# Patient Record
Sex: Female | Born: 1990 | Race: White | Hispanic: No | Marital: Single | Smoking: Never smoker
Health system: Southern US, Community
[De-identification: ages and names within clinical notes are randomized; demographics above are authoritative.]

## PROBLEM LIST (undated history)

## (undated) HISTORY — PX: TONSILLECTOMY AND ADENOIDECTOMY: SUR1326

---
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
## 2011-04-10 ENCOUNTER — Emergency Department (HOSPITAL_BASED_OUTPATIENT_CLINIC_OR_DEPARTMENT_OTHER)
Admission: EM | Admit: 2011-04-10 | Discharge: 2011-04-10 | Disposition: A | Discharge status: Home / Self Care | Payer: Self-pay | Attending: Emergency Medicine | Admitting: Emergency Medicine

## 2011-04-10 ENCOUNTER — Encounter: Payer: Self-pay | Admitting: *Deleted

## 2011-04-10 ENCOUNTER — Emergency Department (INDEPENDENT_AMBULATORY_CARE_PROVIDER_SITE_OTHER): Payer: Self-pay

## 2011-04-10 DIAGNOSIS — S39012A Strain of muscle, fascia and tendon of lower back, initial encounter: Secondary | ICD-10-CM

## 2011-04-10 DIAGNOSIS — R51 Headache: Secondary | ICD-10-CM

## 2011-04-10 DIAGNOSIS — S161XXA Strain of muscle, fascia and tendon at neck level, initial encounter: Secondary | ICD-10-CM

## 2011-04-10 DIAGNOSIS — Y9364 Activity, baseball: Secondary | ICD-10-CM | POA: Insufficient documentation

## 2011-04-10 DIAGNOSIS — R42 Dizziness and giddiness: Secondary | ICD-10-CM | POA: Insufficient documentation

## 2011-04-10 DIAGNOSIS — M542 Cervicalgia: Secondary | ICD-10-CM

## 2011-04-10 DIAGNOSIS — S335XXA Sprain of ligaments of lumbar spine, initial encounter: Secondary | ICD-10-CM | POA: Insufficient documentation

## 2011-04-10 DIAGNOSIS — M549 Dorsalgia, unspecified: Secondary | ICD-10-CM | POA: Insufficient documentation

## 2011-04-10 DIAGNOSIS — IMO0002 Reserved for concepts with insufficient information to code with codable children: Secondary | ICD-10-CM

## 2011-04-10 DIAGNOSIS — S060XAA Concussion with loss of consciousness status unknown, initial encounter: Secondary | ICD-10-CM

## 2011-04-10 DIAGNOSIS — Y9239 Other specified sports and athletic area as the place of occurrence of the external cause: Secondary | ICD-10-CM | POA: Insufficient documentation

## 2011-04-10 DIAGNOSIS — R0989 Other specified symptoms and signs involving the circulatory and respiratory systems: Secondary | ICD-10-CM | POA: Insufficient documentation

## 2011-04-10 DIAGNOSIS — X58XXXA Exposure to other specified factors, initial encounter: Secondary | ICD-10-CM

## 2011-04-10 DIAGNOSIS — S060X9A Concussion with loss of consciousness of unspecified duration, initial encounter: Secondary | ICD-10-CM

## 2011-04-10 DIAGNOSIS — S239XXA Sprain of unspecified parts of thorax, initial encounter: Secondary | ICD-10-CM | POA: Insufficient documentation

## 2011-04-10 DIAGNOSIS — S0990XA Unspecified injury of head, initial encounter: Secondary | ICD-10-CM

## 2011-04-10 DIAGNOSIS — S139XXA Sprain of joints and ligaments of unspecified parts of neck, initial encounter: Secondary | ICD-10-CM | POA: Insufficient documentation

## 2011-04-10 DIAGNOSIS — W19XXXA Unspecified fall, initial encounter: Secondary | ICD-10-CM

## 2011-04-10 DIAGNOSIS — W1801XA Striking against sports equipment with subsequent fall, initial encounter: Secondary | ICD-10-CM | POA: Insufficient documentation

## 2011-04-10 LAB — GLUCOSE, CAPILLARY: Glucose-Capillary: 139 mg/dL — ABNORMAL HIGH (ref 70–99)

## 2011-04-10 MED ORDER — HYDROMORPHONE HCL 2 MG/ML IJ SOLN
2.0000 mg | Freq: Once | INTRAMUSCULAR | Status: AC
  Administered 2011-04-10: 2 mg via INTRAMUSCULAR
  Filled 2011-04-10: qty 1

## 2011-04-10 MED ORDER — ONDANSETRON 4 MG PO TBDP
ORAL_TABLET | ORAL | Status: AC
  Filled 2011-04-10: qty 2

## 2011-04-10 MED ORDER — HYDROCODONE-ACETAMINOPHEN 5-325 MG PO TABS: 2.0000 | ORAL_TABLET | ORAL | Status: AC | PRN

## 2011-04-10 MED ORDER — ONDANSETRON 8 MG PO TBDP
8.0000 mg | ORAL_TABLET | Freq: Once | ORAL | Status: AC
  Administered 2011-04-10: 8 mg via ORAL
  Filled 2011-04-10: qty 1

## 2011-04-10 MED ORDER — ONDANSETRON 8 MG PO TBDP: 8.0000 mg | ORAL_TABLET | Freq: Three times a day (TID) | ORAL | Status: AC | PRN

## 2011-04-10 MED ORDER — PROMETHAZINE HCL 25 MG PO TABS
25.0000 mg | ORAL_TABLET | Freq: Once | ORAL | Status: AC
  Administered 2011-04-10: 25 mg via ORAL
  Filled 2011-04-10: qty 1

## 2011-04-10 MED ORDER — IBUPROFEN 600 MG PO TABS: 600.0000 mg | ORAL_TABLET | Freq: Three times a day (TID) | ORAL | Status: AC | PRN

## 2011-04-10 NOTE — ED Notes (Signed)
Taken off the back board. Pt states she feels much better off the board. c collar remains in place. Neuro intact.

## 2011-04-10 NOTE — ED Notes (Signed)
Parents notified per pt request.  Mom, Olivia Carrillo stated they were enroute

## 2011-04-10 NOTE — ED Notes (Signed)
Pt vomited as discharge instructions were given. MD aware. Zofran requested.

## 2011-04-10 NOTE — ED Notes (Signed)
Zofran not given. Plan to give Phenergan and observe pt til she feels better

## 2011-04-10 NOTE — ED Notes (Signed)
Pt presents to ED today via [facility] following injury while playing softball.  EMS reports "player collision head to collarbone.  LOC unknown"  Pt reports +LOC and is a&ox4 now.  Pt presnts on LSB and c-collar

## 2011-04-12 NOTE — ED Provider Notes (Signed)
History     CSN: [id number] Arrival date & time: 04/10/2011  7:44 PM  Chief Complaint  Patient presents with  . Fall  . Neck Injury   Patient is a 20 y.o. female presenting with neck injury, head injury, and back pain. The history is provided by the patient, a parent and the EMS personnel.  Neck Injury This is a new problem. The current episode started less than 1 hour ago. The problem occurs constantly. The problem has been gradually improving. Associated symptoms include chest pain and headaches. Pertinent negatives include no abdominal pain and no shortness of breath. The symptoms are aggravated by nothing. The symptoms are relieved by relaxation, position and rest. Treatments tried: immobization, long spine board by ems using cervical collar. The treatment provided mild relief.  Head Injury  The incident occurred less than 1 hour ago. She came to the ER via EMS. The injury mechanism was a direct blow (the patient was playing softball and collided with another player, hitting her head on the other player's chest). She lost consciousness for a period of less than one minute. There was no blood loss. The quality of the pain is described as dull and throbbing. The pain is moderate. The pain has been constant since the injury. Associated symptoms include disorientation. Pertinent negatives include no numbness, no blurred vision, no vomiting, no tinnitus, no weakness and no memory loss. She was found conscious by EMS personnel. Treatment on the scene included a backboard and a c-collar. She has tried nothing for the symptoms.  Back Pain  This is a new problem. The current episode started less than 1 hour ago. The problem occurs constantly. The problem has been gradually improving. The pain is associated with falling (as above). The pain is present in the thoracic spine and lumbar spine. The quality of the pain is described as aching. The pain does not radiate. The pain is moderate. Exacerbated by:  movement and palpation. Associated symptoms include chest pain and headaches. Pertinent negatives include no fever, no numbness, no abdominal pain, no bowel incontinence, no perianal numbness, no bladder incontinence, no leg pain, no paresthesias, no paresis, no tingling and no weakness. She has tried nothing for the symptoms.    History reviewed. No pertinent past medical history.  History reviewed. No pertinent past surgical history.  No family history on file.  History  Substance Use Topics  . Smoking status: Never Smoker   . Smokeless tobacco: Not on file  . Alcohol Use: No    OB History    Grav Para Term Preterm Abortions TAB SAB Ect Mult Living                  Review of Systems  Constitutional: Negative for fever.  HENT: Positive for neck pain and neck stiffness. Negative for hearing loss, ear pain, nosebleeds, congestion, facial swelling, rhinorrhea, drooling, trouble swallowing, dental problem, voice change, postnasal drip, tinnitus and ear discharge.   Eyes: Negative for blurred vision, pain and visual disturbance.  Respiratory: Negative for cough, chest tightness, shortness of breath, wheezing and stridor.   Cardiovascular: Positive for chest pain.  Gastrointestinal: Positive for nausea. Negative for vomiting, abdominal pain, abdominal distention and bowel incontinence.  Genitourinary: Negative for bladder incontinence and flank pain.  Musculoskeletal: Positive for myalgias and back pain. Negative for joint swelling, arthralgias and gait problem.  Skin: Negative for color change, pallor, rash and wound.  Neurological: Positive for light-headedness and headaches. Negative for dizziness, tingling, tremors, seizures, facial  asymmetry, speech difficulty, weakness, numbness and paresthesias.  Hematological: Does not bruise/bleed easily.  Psychiatric/Behavioral: Negative.  Negative for memory loss.    Physical Exam  BP 104/70  Pulse 68  Temp(Src) 98.1 F (36.7 C) (Oral)   Resp 18  SpO2 100%  LMP 04/05/2011  Physical Exam  Nursing note and vitals reviewed. Constitutional: She is oriented to person, place, and time. She appears well-developed and well-nourished. She appears distressed.       The patient appears in mild discomfort  HENT:  Head: Normocephalic and atraumatic. Head is without raccoon's eyes, without Battle's sign, without contusion and without laceration.  Right Ear: Hearing, tympanic membrane, external ear and ear canal normal. No drainage or tenderness. No mastoid tenderness. Tympanic membrane is not perforated. No hemotympanum.  Left Ear: Hearing, tympanic membrane, external ear and ear canal normal. No drainage or tenderness. No mastoid tenderness. Tympanic membrane is not perforated. No hemotympanum.  Nose: Nose normal. No mucosal edema, rhinorrhea, sinus tenderness, nasal deformity, septal deviation or nasal septal hematoma. No epistaxis. Right sinus exhibits no maxillary sinus tenderness and no frontal sinus tenderness. Left sinus exhibits no maxillary sinus tenderness and no frontal sinus tenderness.  Mouth/Throat: Uvula is midline, oropharynx is clear and moist and mucous membranes are normal. Normal dentition. No lacerations.  Eyes: Conjunctivae and EOM are normal. Pupils are equal, round, and reactive to light.  Neck: Trachea normal and phonation normal. No JVD present. Spinous process tenderness and muscular tenderness present. No tracheal tenderness present. Carotid bruit is present. No tracheal deviation present.       Immobilized in c-collar  Cardiovascular: Normal rate, regular rhythm, normal heart sounds and intact distal pulses.  Exam reveals no gallop and no friction rub.   No murmur heard. Pulmonary/Chest: Effort normal and breath sounds normal. No accessory muscle usage or stridor. No respiratory distress. She has no decreased breath sounds. She has no wheezes. She has no rales. She exhibits no tenderness, no bony tenderness, no  crepitus, no deformity and no retraction.  Abdominal: Soft. Bowel sounds are normal. She exhibits no distension and no mass. There is no tenderness. There is no rebound and no guarding.  Musculoskeletal: Normal range of motion. She exhibits no edema and no tenderness.       Cervical back: She exhibits tenderness, bony tenderness and pain. She exhibits no swelling and no deformity.       Thoracic back: She exhibits tenderness, bony tenderness and pain. She exhibits no deformity.       Lumbar back: She exhibits tenderness, bony tenderness and pain. She exhibits no deformity.  Neurological: She is alert and oriented to person, place, and time. She has normal reflexes. She is not disoriented. She displays normal reflexes. No cranial nerve deficit or sensory deficit. She exhibits normal muscle tone. Coordination normal. GCS eye subscore is 4. GCS verbal subscore is 5. GCS motor subscore is 6.  Skin: Skin is warm and dry. No rash noted. No erythema. No pallor.  Psychiatric: She has a normal mood and affect. Her behavior is normal. Judgment and thought content normal.    ED Course  Procedures  MDM Closed head injury, concussion, cervical/thoracic/lumbar strain, fracture, dislocation  Dg Thoracic Spine W/swimmers  04/10/2011  *RADIOLOGY REPORT*  Clinical Data: Status post collision, with back pain.  THORACIC SPINE - 2 VIEW + SWIMMERS  Comparison: None.  Findings: The imaged vertebral bodies and inter-vertebral disc spaces are maintained. No displaced acute fracture or dislocation identified.   The para-vertebral  and overlying soft tissues are within normal limits.  IMPRESSION: No acute osseous abnormality.  Original Report Authenticated By: Waneta Martins, M.D.   Dg Lumbar Spine Complete  04/10/2011  *RADIOLOGY REPORT*  Clinical Data: Back pain status post collision.  LUMBAR SPINE - COMPLETE 4+ VIEW  Comparison: Contemporaneous thoracic spine radiograph  Findings: The lumbar vertebral bodies and  inter-vertebral disc spaces are maintained. No displaced acute fracture or dislocation identified.   The para-vertebral and overlying soft tissues are within normal limits.  IMPRESSION: No acute osseous abnormality.  Original Report Authenticated By: Waneta Martins, M.D.   Ct Head Wo Contrast  04/10/2011  *RADIOLOGY REPORT*  Clinical Data:  Collision with another player, now with headache and neck pain  CT HEAD WITHOUT CONTRAST CT CERVICAL SPINE WITHOUT CONTRAST  Technique:  Multidetector CT imaging of the head and cervical spine was performed following the standard protocol without intravenous contrast.  Multiplanar CT image reconstructions of the cervical spine were also generated.  Comparison:  None  CT HEAD  Findings: Wallace Cullens white differentiation is maintained.  No CT evidence of acute infarct.  No intraparenchymal or extra-axial mass or hemorrhage.  Ventricles and basilar cisterns are normal in size configuration.  No midline shift.  The visualized paranasal sinuses and mastoid air cells are normal.  Regional soft tissues are normal.  No calvarial fracture.  IMPRESSION: No acute intraparenchymal disease.  CT CERVICAL SPINE  Findings: There is mild straightening of the expected cervical lordosis.  No anterolisthesis or retrolisthesis.  Normal atlanto- odontoid and atlantoaxial articulations.  The dens is normally positioned between the lateral masses of C1.  Vertebral body heights are preserved.  Prevertebral soft tissues are normal.  No fracture.  Intervertebral disc spaces are preserved.  Regional soft tissues are normal.  Limited visualization of the lung apices is normal.  IMPRESSION: No fracture or static subluxation of the cervical spine.  As ligamentous injury is not excluded, continue cervical spine precautions are recommended until the patient is cleared clinically.  Original Report Authenticated By: Waynard Reeds, M.D.   Ct Cervical Spine Wo Contrast  04/10/2011  *RADIOLOGY REPORT*  Clinical  Data:  Collision with another player, now with headache and neck pain  CT HEAD WITHOUT CONTRAST CT CERVICAL SPINE WITHOUT CONTRAST  Technique:  Multidetector CT imaging of the head and cervical spine was performed following the standard protocol without intravenous contrast.  Multiplanar CT image reconstructions of the cervical spine were also generated.  Comparison:  None  CT HEAD  Findings: Wallace Cullens white differentiation is maintained.  No CT evidence of acute infarct.  No intraparenchymal or extra-axial mass or hemorrhage.  Ventricles and basilar cisterns are normal in size configuration.  No midline shift.  The visualized paranasal sinuses and mastoid air cells are normal.  Regional soft tissues are normal.  No calvarial fracture.  IMPRESSION: No acute intraparenchymal disease.  CT CERVICAL SPINE  Findings: There is mild straightening of the expected cervical lordosis.  No anterolisthesis or retrolisthesis.  Normal atlanto- odontoid and atlantoaxial articulations.  The dens is normally positioned between the lateral masses of C1.  Vertebral body heights are preserved.  Prevertebral soft tissues are normal.  No fracture.  Intervertebral disc spaces are preserved.  Regional soft tissues are normal.  Limited visualization of the lung apices is normal.  IMPRESSION: No fracture or static subluxation of the cervical spine.  As ligamentous injury is not excluded, continue cervical spine precautions are recommended until the patient is cleared clinically.  Original Report Authenticated By: Waynard Reeds, M.D.   Dg Cerv Spine Flex&ext Only  04/10/2011  *RADIOLOGY REPORT*  Clinical Data: Posterior neck pain.  Evaluate for ligamentous instability  CERVICAL SPINE - FLEXION AND EXTENSION VIEWS ONLY  Comparison: CT same date  Findings: C1 through the cervical thoracic junction is visualized in its entirety.  No listhesis is seen on flexion or extension views.  Normal mobility is identified. No precervical soft tissue  widening is present.  No acute osseous abnormality.  IMPRESSION: No listhesis is identified on flexion or extension views.  Original Report Authenticated By: Harrel Lemon, M.D.   Cervical spine cleared by radiographs.  The patient had no pain with movement of her head in rotation or flexion/extension with the C-collar removed, but due to mild sedation after analgesics/mm. Relaxants, a flex-ext. Series was obtained to assure no ligamentous injury.  The patient appears to have had a minor head injury sustaining a concussion, and cervical/thoracic/lumbar strain.  She was discharged awake, alert, and oriented with improved comfort to her family's care.      Felisa Bonier, MD 04/12/11 2153

## 2013-02-10 ENCOUNTER — Encounter: Payer: Self-pay | Admitting: Family Medicine

## 2013-02-10 ENCOUNTER — Ambulatory Visit (INDEPENDENT_AMBULATORY_CARE_PROVIDER_SITE_OTHER): Payer: BC Managed Care – PPO | Admitting: Family Medicine

## 2013-02-10 VITALS — BP 112/88 | HR 96 | Temp 98.1°F | Ht 70.25 in | Wt 186.0 lb

## 2013-02-10 DIAGNOSIS — Z87898 Personal history of other specified conditions: Secondary | ICD-10-CM

## 2013-02-10 DIAGNOSIS — Z833 Family history of diabetes mellitus: Secondary | ICD-10-CM

## 2013-02-10 DIAGNOSIS — Z136 Encounter for screening for cardiovascular disorders: Secondary | ICD-10-CM

## 2013-02-10 DIAGNOSIS — Z9189 Other specified personal risk factors, not elsewhere classified: Secondary | ICD-10-CM

## 2013-02-10 LAB — CBC WITH DIFFERENTIAL/PLATELET
Basophils Absolute: 0 10*3/uL (ref 0.0–0.1)
Eosinophils Absolute: 0.1 10*3/uL (ref 0.0–0.7)
HCT: 43.3 % (ref 36.0–46.0)
Lymphs Abs: 2.2 10*3/uL (ref 0.7–4.0)
MCHC: 33.9 g/dL (ref 30.0–36.0)
MCV: 92.6 fl (ref 78.0–100.0)
Monocytes Absolute: 0.5 10*3/uL (ref 0.1–1.0)
Neutrophils Relative %: 55.5 % (ref 43.0–77.0)
Platelets: 236 10*3/uL (ref 150.0–400.0)
RDW: 12.5 % (ref 11.5–14.6)

## 2013-02-10 LAB — COMPREHENSIVE METABOLIC PANEL
Alkaline Phosphatase: 58 U/L (ref 39–117)
CO2: 31 mEq/L (ref 19–32)
Creatinine, Ser: 0.8 mg/dL (ref 0.4–1.2)
GFR: 93.79 mL/min (ref >60.00)
Glucose, Bld: 104 mg/dL — ABNORMAL HIGH (ref 70–99)
Sodium: 138 mEq/L (ref 135–145)
Total Bilirubin: 0.6 mg/dL (ref 0.3–1.2)
Total Protein: 8.2 g/dL (ref 6.0–8.3)

## 2013-02-10 LAB — LIPID PANEL
Cholesterol: 227 mg/dL — ABNORMAL HIGH (ref 0–200)
HDL: 44.3 mg/dL (ref >39.00)
Total CHOL/HDL Ratio: 5
VLDL: 42.2 mg/dL — ABNORMAL HIGH (ref 0.0–40.0)

## 2013-02-10 LAB — LDL CHOLESTEROL, DIRECT: Direct LDL: 137.8 mg/dL

## 2013-02-10 NOTE — Progress Notes (Signed)
  Subjective:    Patient ID: Olivia Carrillo, female    DOB: 11/17/1990, 22 y.o.   MRN: [id number]  HPI  22 yo very pleasant female here to establish care.  She states that her mom is concerned that she has diabetes.  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 Feels she needs to eat every couple of hours or she gets light headed.  No recent syncope.  Did have syncopal episode once in shower in 2010.  None since. Not sure if her dad has DM but her dad's dad has DM and HTN.  No increased thirst, urination or weight loss.  Not sexually active.  Never had a pap smear. No family h/o breast, uterine, or cervical CA.  Patient Active Problem List   Diagnosis Date Noted  . Family history of diabetes mellitus 02/10/2013  . History of syncope 02/10/2013   No past medical history on file. Past Surgical History  Procedure Laterality Date  . Tonsillectomy and adenoidectomy     History  Substance Use Topics  . Smoking status: Never Smoker   . Smokeless tobacco: Not on file  . Alcohol Use: No   Family History  Problem Relation Age of Onset  . Diabetes Paternal Grandmother   . Hypertension Paternal Grandmother    No Known Allergies No current outpatient prescriptions on file prior to visit.   No current facility-administered medications on file prior to visit.   The PMH, PSH, Social History, Family History, Medications, and allergies have been reviewed in [facility], and have been updated if relevant.   Review of Systems See HPI No CP No SOB No dizziness    Objective:   Physical Exam  Constitutional: She is oriented to person, place, and time. She appears well-developed and well-nourished. No distress.  HENT:  Head: Normocephalic.  Right Ear: External ear normal.  Left Ear: External ear normal.  Eyes: Conjunctivae and EOM are normal. Pupils are equal, round, and reactive to light.  Cardiovascular: Normal rate, regular rhythm and normal heart sounds.    Pulmonary/Chest: Effort normal and breath sounds normal.  Abdominal: Soft. Bowel sounds are normal.  Neurological: She is alert and oriented to person, place, and time. She has normal reflexes.  Skin: Skin is warm and dry.  Psychiatric: She has a normal mood and affect. Her behavior is normal. Judgment normal.   BP 112/88  Pulse 96  Temp(Src) 98.1 F (36.7 C)  Ht 5' 10.25" (1.784 m)  Wt 186 lb (84.369 kg)  BMI 26.51 kg/m2        Assessment & Plan:  1. Family history of diabetes mellitus No concerning symptoms on history but will check labs today. - Hemoglobin A1c  2. History of syncope No recurrent episodes.  Was likely due to dehydration at the time. - Comprehensive metabolic panel - TSH - CBC with Differential  3. Screening for ischemic heart disease  - Lipid Panel

## 2013-02-10 NOTE — Patient Instructions (Addendum)
It was nice to meet you. Please make an appointment for a complete physical/pap smear.  We will call you with your labs results.

## 2013-02-14 ENCOUNTER — Encounter: Payer: Self-pay | Admitting: Family Medicine

## 2013-04-28 ENCOUNTER — Encounter: Payer: BC Managed Care – PPO | Admitting: Family Medicine

## 2013-06-16 ENCOUNTER — Other Ambulatory Visit: Payer: Self-pay

## 2013-08-30 ENCOUNTER — Encounter: Payer: BC Managed Care – PPO | Admitting: Family Medicine

## 2014-03-24 ENCOUNTER — Encounter: Payer: Self-pay | Admitting: Family Medicine

## 2014-03-24 ENCOUNTER — Ambulatory Visit (INDEPENDENT_AMBULATORY_CARE_PROVIDER_SITE_OTHER): Payer: BC Managed Care – PPO | Admitting: Family Medicine

## 2014-03-24 VITALS — BP 110/76 | HR 92 | Temp 98.0°F | Ht 70.0 in | Wt 165.5 lb

## 2014-03-24 DIAGNOSIS — Z0289 Encounter for other administrative examinations: Secondary | ICD-10-CM

## 2014-03-24 DIAGNOSIS — Z23 Encounter for immunization: Secondary | ICD-10-CM

## 2014-03-24 DIAGNOSIS — Z020 Encounter for examination for admission to educational institution: Secondary | ICD-10-CM | POA: Insufficient documentation

## 2014-03-24 NOTE — Progress Notes (Signed)
Pre visit review using our clinic review tool, if applicable. No additional management support is needed unless otherwise documented below in the visit note. 

## 2014-03-24 NOTE — Assessment & Plan Note (Signed)
[facility] records printed out and reviewed with pt. Does not appear complete but she says she has some at home and [facility]. Tdap given today.

## 2014-03-24 NOTE — Progress Notes (Signed)
   Subjective:   Patient ID: Olivia Carrillo, female    DOB: 05-Oct-1990, 23 y.o.   MRN: [id number]  Olivia Carrillo is a pleasant 23 y.o. year old female who presents to clinic today with Immunizations  on 03/24/2014  HPI:  Was in school for criminal justice but she did not like it.   Has now decided to go back to school for kinesiology.  Not sure which immunzations she needs but thinks she definitely needs Tdap.  No current outpatient prescriptions on file prior to visit.   No current facility-administered medications on file prior to visit.    No Known Allergies  No past medical history on file.  Past Surgical History  Procedure Laterality Date  . Tonsillectomy and adenoidectomy      Family History  Problem Relation Age of Onset  . Diabetes Paternal Grandmother   . Hypertension Paternal Grandmother     History   Social History  . Marital Status: Single    Spouse Name: N/A    Number of Children: N/A  . Years of Education: N/A   Occupational History  . Not on file.   Social History Main Topics  . Smoking status: Never Smoker   . Smokeless tobacco: Not on file  . Alcohol Use: No  . Drug Use: No  . Sexual Activity:    Other Topics Concern  . 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   The PMH, PSH, Social History, Family History, Medications, and allergies have been reviewed [facility], and have been updated if relevant.    Review of Systems    See HPI +intermittent headaches Denies anxiety or depression No SI or HI Objective:    BP 110/76  Pulse 92  Temp(Src) 98 F (36.7 C) (Oral)  Ht 5\' 10"  (1.778 m)  Wt 165 lb 8 oz (75.07 kg)  BMI 23.75 kg/m2  SpO2 97%  LMP 02/26/2014   Physical Exam  Nursing note and vitals reviewed. Constitutional: She appears well-developed and well-nourished. No distress.  Cardiovascular: Normal rate, regular rhythm and normal heart sounds.   Pulmonary/Chest: Effort  normal and breath sounds normal.  Skin: Skin is warm and dry.  Psychiatric: She has a normal mood and affect. Her behavior is normal. Thought content normal.          Assessment & Plan:   Need for Tdap vaccination - Plan: Tdap vaccine greater than or equal to 7yo IM  Encounter for school history and physical examination No Follow-up on file.

## 2014-06-13 ENCOUNTER — Ambulatory Visit (INDEPENDENT_AMBULATORY_CARE_PROVIDER_SITE_OTHER): Payer: BC Managed Care – PPO | Admitting: Internal Medicine

## 2014-06-13 ENCOUNTER — Encounter: Payer: Self-pay | Admitting: Internal Medicine

## 2014-06-13 VITALS — BP 112/80 | HR 98 | Temp 98.1°F | Wt 170.0 lb

## 2014-06-13 DIAGNOSIS — J309 Allergic rhinitis, unspecified: Secondary | ICD-10-CM

## 2014-06-13 DIAGNOSIS — B309 Viral conjunctivitis, unspecified: Secondary | ICD-10-CM

## 2014-06-13 NOTE — Progress Notes (Signed)
HPI  Pt presents to the clinic today with c/o cough, chest congestions, nasal congestion and post nasal drip. She reports this started 4 days ago. She denies fever, chills or body aches. She is blowing clear mucous out of her nose. She also reports that the last 2 days her left eye has been matted shut when she wakes up. It is irritated but not painful. She has tried Careers adviserallegra and Nyquil without much relief. She does have a history of seasonal allergies. She has not had sick contacts. She does not smoke.  Review of Systems     History reviewed. No pertinent past medical history.  Family History  Problem Relation Age of Onset  . Diabetes Paternal Grandmother   . Hypertension Paternal Grandmother     History   Social History  . Marital Status: Single    Spouse Name: N/A    Number of Children: N/A  . Years of Education: N/A   Occupational History  . Not on file.   Social History Main Topics  . Smoking status: Never Smoker   . Smokeless tobacco: Not on file  . Alcohol Use: No  . Drug Use: No  . Sexual Activity: Not on file   Other Topics Concern  . 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    No Known Allergies   Constitutional:  Denies headache, fatigue, fever or abrupt weight changes.  HEENT:  Positive nasal congestion, sore throat. Denies eye redness, eye pain, pressure behind the eyes, facial pain, nasal congestion, ear pain, ringing in the ears, wax buildup, runny nose or bloody nose. Respiratory: Positive cough. Denies difficulty breathing or shortness of breath.  Cardiovascular: Denies chest pain, chest tightness, palpitations or swelling in the hands or feet.   No other specific complaints in a complete review of systems (except as listed in HPI above).  Objective:   BP 112/80 mmHg  Pulse 98  Temp(Src) 98.1 F (36.7 C) (Oral)  Wt 170 lb (77.111 kg)  SpO2 98%  LMP 05/23/2014 Wt Readings from Last 3  Encounters:  06/13/14 170 lb (77.111 kg)  03/24/14 165 lb 8 oz (75.07 kg)  02/10/13 186 lb (84.369 kg)     General: Appears her stated age, well developed, well nourished in NAD. HEENT: Head: normal shape and size; Eyes: sclera injected no icterus, conjunctiva pink, PERRLA and EOMs intact; Ears: Tm's gray and intact, normal light reflex; Nose: mucosa pink and moist, septum midline; Throat/Mouth: + PND. Teeth present, mucosa erythematous and moist, no exudate noted, no lesions or ulcerations noted.   Cardiovascular: Normal rate and rhythm. S1,S2 noted.  No murmur, rubs or gallops noted. Pulmonary/Chest: Normal effort and positive vesicular breath sounds. No respiratory distress. No wheezes, rales or ronchi noted.      Assessment & Plan:  Allergic Rhintis:  Get some rest and drink plenty of water Do salt water gargles for the sore throat Try some flonase in addition to your allegra Watch for fever, chills or colored nasal mucous  Viral conjunctivitis left eye:  No evidence of bacterial infection Try saline eye drops Warm compresses TID   RTC as needed or if symptoms persist or worsen  RTC as needed or if symptoms persist.

## 2014-06-13 NOTE — Patient Instructions (Signed)
Viral Conjunctivitis Conjunctivitis is an irritation (inflammation) of the clear membrane that covers the white part of the eye (the conjunctiva). The irritation can also happen on the underside of the eyelids. Conjunctivitis makes the eye red or pink in color. This is what is commonly known as pink eye. Viral conjunctivitis can spread easily (contagious). CAUSES   Infection from virus on the surface of the eye.  Infection from the irritation or injury of nearby tissues such as the eyelids or cornea.  More serious inflammation or infection on the inside of the eye.  Other eye diseases.  The use of certain eye medications. SYMPTOMS  The normally white color of the eye or the underside of the eyelid is usually pink or red in color. The pink eye is usually associated with irritation, tearing and some sensitivity to light. Viral conjunctivitis is often associated with a clear, watery discharge. If a discharge is present, there may also be some blurred vision in the affected eye. DIAGNOSIS  Conjunctivitis is diagnosed by an eye exam. The eye specialist looks for changes in the surface tissues of the eye which take on changes characteristic of the specific types of conjunctivitis. A sample of any discharge may be collected on a Q-Tip (sterile swap). The sample will be sent to a lab to see whether or not the inflammation is caused by bacterial or viral infection. TREATMENT  Viral conjunctivitis will not respond to medicines that kill germs (antibiotics). Treatment is aimed at stopping a bacterial infection on top of the viral infection. The goal of treatment is to relieve symptoms (such as itching) with antihistamine drops or other eye medications.  HOME CARE INSTRUCTIONS   To ease discomfort, apply a cool, clean wash cloth to your eye for 10 to 20 minutes, 3 to 4 times a day.  Gently wipe away any drainage from the eye with a warm, wet washcloth or a cotton ball.  Wash your hands often with soap  and use paper towels to dry.  Do not share towels or washcloths. This may spread the infection.  Change or wash your pillowcase every day.  You should not use eye make-up until the infection is gone.  Stop using contacts lenses. Ask your eye professional how to sterilize or replace them before using again. This depends on the type of contact lenses used.  Do not touch the edge of the eyelid with the eye drop bottle or ointment tube when applying medications to the affected eye. This will stop you from spreading the infection to the other eye or to others. SEEK IMMEDIATE MEDICAL CARE IF:   The infection has not improved within 3 days of beginning treatment.  A watery discharge from the eye develops.  Pain in the eye increases.  The redness is spreading.  Vision becomes blurred.  An oral temperature above 102 F (38.9 C) develops, or as your caregiver suggests.  Facial pain, redness or swelling develops.  Any problems that may be related to the prescribed medicine develop. MAKE SURE YOU:   Understand these instructions.  Will watch your condition.  Will get help right away if you are not doing well or get worse. Document Released: 07/28/2005 Document Revised: 10/20/2011 Document Reviewed: 03/16/2008 ExitCare Patient Information 2015 [employer]. This information is not intended to replace advice given to you by your health care provider. Make sure you discuss any questions you have with your health care provider.  

## 2014-06-13 NOTE — Progress Notes (Signed)
Pre visit review using our clinic review tool, if applicable. No additional management support is needed unless otherwise documented below in the visit note. 

## 2014-11-08 ENCOUNTER — Encounter: Payer: Self-pay | Admitting: Primary Care

## 2014-11-08 ENCOUNTER — Ambulatory Visit (INDEPENDENT_AMBULATORY_CARE_PROVIDER_SITE_OTHER): Payer: 59 | Admitting: Primary Care

## 2014-11-08 VITALS — BP 116/72 | HR 92 | Temp 98.5°F | Ht 70.0 in | Wt 176.0 lb

## 2014-11-08 DIAGNOSIS — H6123 Impacted cerumen, bilateral: Secondary | ICD-10-CM | POA: Diagnosis not present

## 2014-11-08 NOTE — Progress Notes (Signed)
Pre visit review using our clinic review tool, if applicable. No additional management support is needed unless otherwise documented below in the visit note. 

## 2014-11-08 NOTE — Progress Notes (Signed)
   Subjective:    Patient ID: Olivia Carrillo, female    DOB: 1991/01/29, 25 y.o.   MRN: [id number]  HPI  Olivia Carrillo is a 24 year old female who presents today with a chief complaint of left ear popping with decreased hearing for the past 3 days. She denies ear pain, sore throat, cough, fevers, itching. She's tried pouring peroxide in her left ear with only temporary relief. Nothing makes her symptoms worse.  Review of Systems  Constitutional: Negative for fever and chills.  HENT: Negative for ear discharge, ear pain and rhinorrhea.        Fullness to left ear  Respiratory: Negative for cough and shortness of breath.   Cardiovascular: Negative for chest pain.  Gastrointestinal: Negative for nausea.  Neurological: Negative for dizziness.       No past medical history on file.  History   Social History  . Marital Status: Single    Spouse Name: N/A  . Number of Children: N/A  . Years of Education: N/A   Occupational History  . Not on file.   Social History Main Topics  . Smoking status: Never Smoker   . Smokeless tobacco: Not on file  . Alcohol Use: No  . Drug Use: No  . Sexual Activity: Not on file   Other Topics Concern  . 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    Past Surgical History  Procedure Laterality Date  . Tonsillectomy and adenoidectomy      Family History  Problem Relation Age of Onset  . Diabetes Paternal Grandmother   . Hypertension Paternal Grandmother     No Known Allergies  No current outpatient prescriptions on file prior to visit.   No current facility-administered medications on file prior to visit.    BP 116/72 mmHg  Pulse 92  Temp(Src) 98.5 F (36.9 C) (Oral)  Ht $R'5\' 10"'nZ$  (1.778 m)  Wt 176 lb (79.833 kg)  BMI 25.25 kg/m2  SpO2 98%  LMP 09/10/2014    Objective:   Physical Exam  Constitutional: She is oriented to person, place, and time. She appears well-developed.    HENT:  Right Ear: External ear normal.  Left Ear: External ear normal.  Nose: Nose normal.  Mouth/Throat: Oropharynx is clear and moist.  Moderate cerumen impaction noted to bilateral ears initially. Copious amoutn of cerumen was removed. Patient able to hear again and feels much better.  Eyes: Conjunctivae are normal. Pupils are equal, round, and reactive to light.  Neck: Neck supple.  Cardiovascular: Normal rate and regular rhythm.   Pulmonary/Chest: Effort normal and breath sounds normal.  Lymphadenopathy:    She has no cervical adenopathy.  Neurological: She is alert and oriented to person, place, and time.  Skin: Skin is warm and dry.          Assessment & Plan:  Cerumen impaction: Decreased hearing with popping to left ear.  Moderate amount of impaction to bilateral ears with copious amount of wax removed with irrigation. She felt better instantly and is able to hear. Recommended Debrox drops or wax kit OTC. Call if development of fevers, ear pain, etc.

## 2014-11-08 NOTE — Patient Instructions (Signed)
Your ears look great and are without infection or fluid. Use Debrox Drops over the counter or a Debrox Wax Kit. Please call me if you develop fevers, ear pain, sore throat. Take care!  Cerumen Impaction A cerumen impaction is when the wax in your ear forms a plug. This plug usually causes reduced hearing. Sometimes it also causes an earache or dizziness. Removing a cerumen impaction can be difficult and painful. The wax sticks to the ear canal. The canal is sensitive and bleeds easily. If you try to remove a heavy wax buildup with a cotton tipped swab, you may push it in further. Irrigation with water, suction, and small ear curettes may be used to clear out the wax. If the impaction is fixed to the skin in the ear canal, ear drops may be needed for a few days to loosen the wax. People who build up a lot of wax frequently can use ear wax removal products available in your local drugstore. SEEK MEDICAL CARE IF:  You develop an earache, increased hearing loss, or marked dizziness. Document Released: 09/04/2004 Document Revised: 10/20/2011 Document Reviewed: 10/25/2009 ExitCare Patient Information 2015 [employer]. This information is not intended to replace advice given to you by your health care provider. Make sure you discuss any questions you have with your health care provider.  

## 2014-12-01 ENCOUNTER — Encounter: Payer: Self-pay | Admitting: Internal Medicine

## 2014-12-01 ENCOUNTER — Ambulatory Visit (INDEPENDENT_AMBULATORY_CARE_PROVIDER_SITE_OTHER): Payer: 59 | Admitting: Internal Medicine

## 2014-12-01 VITALS — BP 114/72 | HR 99 | Temp 98.4°F | Wt 177.0 lb

## 2014-12-01 DIAGNOSIS — S161XXA Strain of muscle, fascia and tendon at neck level, initial encounter: Secondary | ICD-10-CM | POA: Diagnosis not present

## 2014-12-01 MED ORDER — METHOCARBAMOL 500 MG PO TABS: 500.0000 mg | ORAL_TABLET | Freq: Every evening | ORAL | Status: DC | PRN
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
## 2014-12-01 NOTE — Patient Instructions (Signed)

## 2014-12-01 NOTE — Progress Notes (Signed)
Pre visit review using our clinic review tool, if applicable. No additional management support is needed unless otherwise documented below in the visit note. 

## 2015-01-18 ENCOUNTER — Ambulatory Visit (INDEPENDENT_AMBULATORY_CARE_PROVIDER_SITE_OTHER): Payer: 59 | Admitting: Psychology

## 2015-01-18 DIAGNOSIS — F4323 Adjustment disorder with mixed anxiety and depressed mood: Secondary | ICD-10-CM | POA: Diagnosis not present

## 2015-01-22 ENCOUNTER — Ambulatory Visit: Payer: 59 | Admitting: Family Medicine

## 2015-01-24 ENCOUNTER — Ambulatory Visit: Payer: 59 | Admitting: Family Medicine

## 2015-01-29 ENCOUNTER — Ambulatory Visit: Payer: 59 | Admitting: Family Medicine

## 2015-02-01 ENCOUNTER — Ambulatory Visit: Payer: 59 | Admitting: Family Medicine

## 2015-02-01 ENCOUNTER — Ambulatory Visit (INDEPENDENT_AMBULATORY_CARE_PROVIDER_SITE_OTHER): Payer: 59 | Admitting: Psychology

## 2015-02-01 DIAGNOSIS — F4323 Adjustment disorder with mixed anxiety and depressed mood: Secondary | ICD-10-CM

## 2015-02-22 ENCOUNTER — Ambulatory Visit: Payer: 59 | Admitting: Psychology

## 2015-03-29 ENCOUNTER — Ambulatory Visit: Payer: 59 | Admitting: Psychology

## 2015-08-14 ENCOUNTER — Ambulatory Visit (INDEPENDENT_AMBULATORY_CARE_PROVIDER_SITE_OTHER): Payer: Self-pay | Admitting: Nurse Practitioner

## 2015-08-14 VITALS — BP 112/70 | HR 97 | Ht 71.0 in | Wt 187.0 lb

## 2015-08-14 DIAGNOSIS — M62830 Muscle spasm of back: Secondary | ICD-10-CM

## 2015-08-14 MED ORDER — ALL-BODY MASSAGE MISC
1.0000 | Freq: Once | Status: DC
Start: 2015-08-14 — End: 2015-10-11

## 2015-08-14 MED ORDER — METHOCARBAMOL 500 MG PO TABS: 500.0000 mg | ORAL_TABLET | Freq: Every evening | ORAL | Status: DC | PRN
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
## 2015-08-14 NOTE — Patient Instructions (Signed)

## 2015-08-19 ENCOUNTER — Encounter: Payer: Self-pay | Admitting: Nurse Practitioner

## 2015-08-19 DIAGNOSIS — M62830 Muscle spasm of back: Secondary | ICD-10-CM | POA: Insufficient documentation
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
## 2015-09-27 ENCOUNTER — Ambulatory Visit: Payer: BLUE CROSS/BLUE SHIELD | Admitting: Psychology

## 2015-10-11 ENCOUNTER — Ambulatory Visit (INDEPENDENT_AMBULATORY_CARE_PROVIDER_SITE_OTHER): Payer: Self-pay | Admitting: Family Medicine

## 2015-10-11 ENCOUNTER — Encounter: Payer: Self-pay | Admitting: Family Medicine

## 2015-10-11 VITALS — BP 116/68 | HR 78 | Temp 98.0°F | Wt 182.2 lb

## 2015-10-11 DIAGNOSIS — Z1239 Encounter for other screening for malignant neoplasm of breast: Secondary | ICD-10-CM | POA: Diagnosis not present

## 2015-10-11 DIAGNOSIS — Z803 Family history of malignant neoplasm of breast: Secondary | ICD-10-CM

## 2015-10-11 NOTE — Progress Notes (Signed)
   Subjective:   Patient ID: [name], female    DOB: July 24, 1991, 25 y.o.   MRN: [id number]  Jasey Cortez is a pleasant 25 y.o. year old female who presents to clinic today with breast exam  on 10/11/2015  HPI:  G0  Has never had a pap smear.  Maternal grandmother had breast CA in her 89s.  Her mom wanted Tenise to have a breast exam because of this family history.  Margan has not felt any lumps in her breasts and denies any changes in her nipples or breast pain.  No current outpatient prescriptions on file prior to visit.   No current facility-administered medications on file prior to visit.    No Known Allergies  No past medical history on file.  Past Surgical History  Procedure Laterality Date  . Tonsillectomy and adenoidectomy      Family History  Problem Relation Age of Onset  . Diabetes Paternal Grandmother   . Hypertension Paternal Grandmother     Social History   Social History  . Marital Status: Single    Spouse Name: N/A  . Number of Children: N/A  . Years of Education: N/A   Occupational History  . Not on file.   Social History Main Topics  . Smoking status: Never Smoker   . Smokeless tobacco: Not on file  . Alcohol Use: No  . Drug Use: No  . Sexual Activity: Not on file   Other Topics Concern  . 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   The PMH, PSH, Social History, Family History, Medications, and allergies have been reviewed in [facility], and have been updated if relevant.   Review of Systems  Constitutional: Negative.   Respiratory: Negative.   Cardiovascular: Negative.   Musculoskeletal: Negative.   Skin: Negative.   Hematological: Negative.   Psychiatric/Behavioral: Negative.   All other systems reviewed and are negative.      Objective:    BP 116/68 mmHg  Pulse 78  Temp(Src) 98 F (36.7 C) (Oral)  Wt 182 lb 4 oz (82.668 kg)  SpO2 96%  LMP 10/04/2015  (Approximate)   Physical Exam  General:  Well-developed,well-nourished,in no acute distress; alert,appropriate and cooperative throughout examination Head:  normocephalic and atraumatic.   Eyes:  vision grossly intact, pupils equal, pupils round, and pupils reactive to light.   Ears:  R ear normal and L ear normal.   Nose:  no external deformity.   Mouth:  good dentition.   Neck:  No deformities, masses, or tenderness noted. Breasts:  No mass, nodules, thickening, tenderness, bulging, retraction, inflamation, nipple discharge or skin changes noted.   Msk:  No deformity or scoliosis noted of thoracic or lumbar spine.   Extremities:  No clubbing, cyanosis, edema, or deformity noted with normal full range of motion of all joints.   Neurologic:  alert & oriented X3 and gait normal.   Skin:  Intact without suspicious lesions or rashes Axillary Nodes:  No palpable lymphadenopathy Psych:  Cognition and judgment appear intact. Alert and cooperative with normal attention span and concentration. No apparent delusions, illusions, hallucinations       Assessment & Plan:   Family history of breast cancer in female  Encounter for screening breast examination No Follow-up on file.

## 2015-10-11 NOTE — Patient Instructions (Signed)
Great to see you. Please schedule a pap smear at your convenience. You do not need a mammogram until you are 40.

## 2015-10-11 NOTE — Assessment & Plan Note (Signed)
Breast exam done today. Reassurance provided. Advised screening mammography to start at age 25. Advised to schedule a pap smear as she declined having one done today. The patient indicates understanding of these issues and agrees with the plan.

## 2015-10-11 NOTE — Progress Notes (Signed)
Pre visit review using our clinic review tool, if applicable. No additional management support is needed unless otherwise documented below in the visit note. 

## 2015-10-29 ENCOUNTER — Ambulatory Visit (INDEPENDENT_AMBULATORY_CARE_PROVIDER_SITE_OTHER): Payer: BLUE CROSS/BLUE SHIELD | Admitting: Psychology

## 2015-10-29 DIAGNOSIS — F4323 Adjustment disorder with mixed anxiety and depressed mood: Secondary | ICD-10-CM | POA: Diagnosis not present

## 2015-11-16 ENCOUNTER — Ambulatory Visit (INDEPENDENT_AMBULATORY_CARE_PROVIDER_SITE_OTHER): Payer: BLUE CROSS/BLUE SHIELD | Admitting: Psychology

## 2015-11-16 DIAGNOSIS — F4323 Adjustment disorder with mixed anxiety and depressed mood: Secondary | ICD-10-CM

## 2015-11-26 ENCOUNTER — Ambulatory Visit (INDEPENDENT_AMBULATORY_CARE_PROVIDER_SITE_OTHER): Payer: BLUE CROSS/BLUE SHIELD | Admitting: Psychology

## 2015-11-26 DIAGNOSIS — F902 Attention-deficit hyperactivity disorder, combined type: Secondary | ICD-10-CM

## 2015-11-26 DIAGNOSIS — F411 Generalized anxiety disorder: Secondary | ICD-10-CM

## 2015-12-12 ENCOUNTER — Ambulatory Visit (INDEPENDENT_AMBULATORY_CARE_PROVIDER_SITE_OTHER): Payer: BLUE CROSS/BLUE SHIELD | Admitting: Psychology

## 2015-12-12 DIAGNOSIS — F4323 Adjustment disorder with mixed anxiety and depressed mood: Secondary | ICD-10-CM | POA: Diagnosis not present

## 2015-12-13 ENCOUNTER — Ambulatory Visit (INDEPENDENT_AMBULATORY_CARE_PROVIDER_SITE_OTHER): Payer: BLUE CROSS/BLUE SHIELD | Admitting: Psychology

## 2015-12-13 DIAGNOSIS — F411 Generalized anxiety disorder: Secondary | ICD-10-CM

## 2015-12-13 DIAGNOSIS — F902 Attention-deficit hyperactivity disorder, combined type: Secondary | ICD-10-CM | POA: Diagnosis not present

## 2015-12-18 ENCOUNTER — Encounter: Payer: Self-pay | Admitting: Family Medicine

## 2015-12-18 ENCOUNTER — Ambulatory Visit (INDEPENDENT_AMBULATORY_CARE_PROVIDER_SITE_OTHER): Payer: Self-pay | Admitting: Family Medicine

## 2015-12-18 VITALS — BP 106/66 | HR 104 | Temp 97.9°F | Wt 186.8 lb

## 2015-12-18 DIAGNOSIS — F909 Attention-deficit hyperactivity disorder, unspecified type: Secondary | ICD-10-CM | POA: Insufficient documentation

## 2015-12-18 DIAGNOSIS — F902 Attention-deficit hyperactivity disorder, combined type: Secondary | ICD-10-CM

## 2015-12-18 MED ORDER — AMPHETAMINE-DEXTROAMPHET ER 20 MG PO CP24: 20.0000 mg | ORAL_CAPSULE | ORAL | Status: DC
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
## 2015-12-18 NOTE — Patient Instructions (Signed)
Great to see you. We are starting Adderall 20 mg XR daily.  Please take in the morning and call me with an update in a few weeks.

## 2015-12-18 NOTE — Progress Notes (Signed)
Pre visit review using our clinic review tool, if applicable. No additional management support is needed unless otherwise documented below in the visit note. 

## 2015-12-18 NOTE — Assessment & Plan Note (Signed)
New-  >25 minutes spent in face to face time with patient, >50% spent in counselling or coordination of care Discussed formal report results and she is interested in starting a stimulant. Rx printed for Adderall XR 20 mg daily and given to pt. Follow up in a few weeks. Continue psychotherapy with Salomon Fickerri Bauert. The patient indicates understanding of these issues and agrees with the plan.

## 2016-01-10 ENCOUNTER — Ambulatory Visit: Payer: BLUE CROSS/BLUE SHIELD | Admitting: Psychology

## 2016-01-18 ENCOUNTER — Other Ambulatory Visit: Payer: Self-pay | Admitting: Family Medicine

## 2016-01-18 ENCOUNTER — Encounter: Payer: Self-pay | Admitting: Family Medicine

## 2016-01-18 MED ORDER — AMPHETAMINE-DEXTROAMPHET ER 20 MG PO CP24: 20.0000 mg | ORAL_CAPSULE | ORAL | Status: DC

## 2016-01-18 NOTE — Telephone Encounter (Signed)
Lm on pts vm informing her Rx is available for pickup from the front desk. Advised third party unable to pickup

## 2016-01-18 NOTE — Telephone Encounter (Signed)
Last f/u 12/2015 

## 2016-01-18 NOTE — Telephone Encounter (Signed)
RX printed and signed, given to Waynetta 

## 2016-01-30 ENCOUNTER — Encounter: Payer: Self-pay | Admitting: Family Medicine

## 2016-01-31 ENCOUNTER — Encounter: Payer: Self-pay | Admitting: Family Medicine

## 2016-01-31 ENCOUNTER — Other Ambulatory Visit (HOSPITAL_COMMUNITY)
Admission: RE | Admit: 2016-01-31 | Discharge: 2016-01-31 | Disposition: A | Discharge status: Home / Self Care | Payer: BLUE CROSS/BLUE SHIELD | Source: Ambulatory Visit | Attending: Family Medicine | Admitting: Family Medicine

## 2016-01-31 ENCOUNTER — Ambulatory Visit (INDEPENDENT_AMBULATORY_CARE_PROVIDER_SITE_OTHER): Payer: BLUE CROSS/BLUE SHIELD | Admitting: Family Medicine

## 2016-01-31 ENCOUNTER — Telehealth: Payer: Self-pay

## 2016-01-31 VITALS — BP 114/72 | HR 111 | Temp 98.4°F | Wt 175.5 lb

## 2016-01-31 DIAGNOSIS — Z0001 Encounter for general adult medical examination with abnormal findings: Secondary | ICD-10-CM | POA: Diagnosis not present

## 2016-01-31 DIAGNOSIS — R87810 Cervical high risk human papillomavirus (HPV) DNA test positive: Secondary | ICD-10-CM | POA: Diagnosis present

## 2016-01-31 DIAGNOSIS — Z113 Encounter for screening for infections with a predominantly sexual mode of transmission: Secondary | ICD-10-CM | POA: Insufficient documentation

## 2016-01-31 DIAGNOSIS — Z01419 Encounter for gynecological examination (general) (routine) without abnormal findings: Secondary | ICD-10-CM

## 2016-01-31 DIAGNOSIS — F900 Attention-deficit hyperactivity disorder, predominantly inattentive type: Secondary | ICD-10-CM | POA: Diagnosis not present

## 2016-01-31 DIAGNOSIS — Z020 Encounter for examination for admission to educational institution: Secondary | ICD-10-CM

## 2016-01-31 DIAGNOSIS — Z1151 Encounter for screening for human papillomavirus (HPV): Secondary | ICD-10-CM | POA: Insufficient documentation

## 2016-01-31 DIAGNOSIS — H6120 Impacted cerumen, unspecified ear: Secondary | ICD-10-CM | POA: Insufficient documentation

## 2016-01-31 DIAGNOSIS — H6122 Impacted cerumen, left ear: Secondary | ICD-10-CM

## 2016-01-31 MED ORDER — LISDEXAMFETAMINE DIMESYLATE 30 MG PO CAPS: 30.0000 mg | ORAL_CAPSULE | Freq: Every day | ORAL | Status: DC

## 2016-01-31 NOTE — Addendum Note (Signed)
Addended by: Desmond DikeKNIGHT, Kinzlie Harney H on: 01/31/2016 11:16 AM   Modules accepted: Orders

## 2016-01-31 NOTE — Assessment & Plan Note (Signed)
Cannot tolerate side effects of adderall although it did help with concentration. D/c adderall. Rx given to pt for vyvanse 30 mg daily. Call or return to clinic prn if these symptoms worsen or fail to improve as anticipated. The patient indicates understanding of these issues and agrees with the plan.

## 2016-01-31 NOTE — Addendum Note (Signed)
Addended by: Dianne DunARON, Claudene Gatliff M on: 01/31/2016 11:08 AM   Modules accepted: Kipp BroodSmartSet

## 2016-01-31 NOTE — Assessment & Plan Note (Signed)
Ceruminosis is noted.  Wax is removed by syringing and manual debridement. Instructions for home care to prevent wax buildup are given.  

## 2016-01-31 NOTE — Progress Notes (Signed)
Pre visit review using our clinic review tool, if applicable. No additional management support is needed unless otherwise documented below in the visit note. 

## 2016-01-31 NOTE — Addendum Note (Signed)
Addended by: Dianne DunARON, Quentyn Kolbeck M on: 01/31/2016 11:22 AM   Modules accepted: Kipp BroodSmartSet

## 2016-01-31 NOTE — Patient Instructions (Signed)
Great to see you. Please stop taking Adderall.  Start taking Vyvanse 30 mg daily.

## 2016-01-31 NOTE — Progress Notes (Addendum)
Subjective:   Patient ID: Olivia Carrillo, female    DOB: Jun 09, 1991, 25 y.o.   MRN: [id number]  Erie NoeDestiny Carrillo is a E[place] Noepleasant 25 y.o. year old female who presents to clinic today with Follow-up and Gynecologic Exam  on 01/31/2016  HPI: ADHD- saw Dr. Jason FilaBray on 11/26/15. Official report reviewed- "clinically significant degree of both inattentive and hyperactive-impulsive symptoms."  She is also seeing Salomon Fickerri Bauert for psychotherapy.  On 12/18/15, we started her on Adderall XR 20 mg daily. She does not like how adderall makes her feel- does help with concentration but she is having mood swings and decreased appetite.  She has never had a pap smear.  She would like this done as well.  Sexually active with one female partner only.   No current outpatient prescriptions on file prior to visit.   No current facility-administered medications on file prior to visit.    No Known Allergies  No past medical history on file.  Past Surgical History  Procedure Laterality Date  . Tonsillectomy and adenoidectomy      Family History  Problem Relation Age of Onset  . Diabetes Paternal Grandmother   . Hypertension Paternal Grandmother     Social History   Social History  . Marital Status: Single    Spouse Name: N/A  . Number of Children: N/A  . Years of Education: N/A   Occupational History  . Not on file.   Social History Main Topics  . Smoking status: Never Smoker   . Smokeless tobacco: Not on file  . Alcohol Use: No  . Drug Use: No  . Sexual Activity: Not on file   Other Topics Concern  . 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   The PMH, PSH, Social History, Family History, Medications, and allergies have [facility], and have been updated if relevant.   Review of Systems  Constitutional: Positive for appetite change.  HENT: Positive for ear pain.   Genitourinary: Negative.   Psychiatric/Behavioral:  Positive for agitation. Negative for decreased concentration. The patient is nervous/anxious.   All other systems reviewed and are negative.      Objective:    BP 114/72 mmHg  Pulse 111  Temp(Src) 98.4 F (36.9 C) (Oral)  Wt 175 lb 8 oz (79.606 kg)  SpO2 98%  LMP 01/16/2016  Wt Readings from Last 3 Encounters:  01/31/16 175 lb 8 oz (79.606 kg)  12/18/15 186 lb 12 oz (84.709 kg)  10/11/15 182 lb 4 oz (82.668 kg)    Physical Exam  Constitutional: She is oriented to person, place, and time. She appears well-developed and well-nourished. No distress.  HENT:  Head: Normocephalic.  Eyes: Conjunctivae are normal.  Pulmonary/Chest: Effort normal.  Musculoskeletal: Normal range of motion.  Neurological: She is alert and oriented to person, place, and time. No cranial nerve deficit.  Skin: Skin is warm and dry. She is not diaphoretic.  Psychiatric: She has a normal mood and affect. Her behavior is normal. Thought content normal.  Nursing note and vitals reviewed.  Abdomen:  Bowel sounds positive,abdomen soft and non-tender without masses, organomegaly or hernias noted. Rectal:  no external abnormalities.   Genitalia:  Pelvic Exam:        External: normal female genitalia without lesions or masses        Vagina: normal without lesions or masses        Cervix: normal without  lesions or masses        Adnexa: normal bimanual exam without masses or fullness        Uterus: normal by palpation        Pap smear: performed       Assessment & Plan:   Attention deficit hyperactivity disorder (ADHD), predominantly inattentive type  Cerumen impaction, left  Encounter for routine gynecological examination No Follow-up on file.

## 2016-01-31 NOTE — Assessment & Plan Note (Signed)
Pap smear and STD testing done today. 

## 2016-01-31 NOTE — Telephone Encounter (Signed)
Pt left v/m requesting [facility] on [facility]. Pt request cb when completed.

## 2016-01-31 NOTE — Telephone Encounter (Signed)
We will have to await the pharmacy before completing PA

## 2016-02-01 LAB — CYTOLOGY - PAP

## 2016-02-04 ENCOUNTER — Telehealth: Payer: Self-pay | Admitting: *Deleted

## 2016-02-04 LAB — CERVICOVAGINAL ANCILLARY ONLY
CANDIDA VAGINITIS: NEGATIVE
Herpes: NEGATIVE

## 2016-02-04 NOTE — Telephone Encounter (Signed)
PA for vyvanse done at [email or web address]. They have approved the vyvanse from 02/04/16- 08/10/2038 and [facility], when I called [facility] she advise me that it's still denied in her sxs and request that we call back later to see if it goes through before we let pt know

## 2016-02-06 ENCOUNTER — Encounter: Payer: Self-pay | Admitting: *Deleted

## 2016-03-05 ENCOUNTER — Other Ambulatory Visit: Payer: Self-pay | Admitting: Family Medicine

## 2016-03-05 MED ORDER — LISDEXAMFETAMINE DIMESYLATE 30 MG PO CAPS: 30.0000 mg | ORAL_CAPSULE | Freq: Every day | ORAL | 0 refills | Status: DC

## 2016-03-05 NOTE — Telephone Encounter (Signed)
OK to print out and put in my box for signature.

## 2016-03-05 NOTE — Telephone Encounter (Signed)
Last f/u 01/2016 

## 2016-03-06 NOTE — Telephone Encounter (Signed)
Pt came in to office, inquired, and picked up Rx

## 2016-03-27 ENCOUNTER — Ambulatory Visit (INDEPENDENT_AMBULATORY_CARE_PROVIDER_SITE_OTHER): Payer: BLUE CROSS/BLUE SHIELD | Admitting: Psychology

## 2016-03-27 DIAGNOSIS — F4323 Adjustment disorder with mixed anxiety and depressed mood: Secondary | ICD-10-CM

## 2016-04-03 ENCOUNTER — Other Ambulatory Visit: Payer: Self-pay | Admitting: Family Medicine

## 2016-04-03 MED ORDER — LISDEXAMFETAMINE DIMESYLATE 30 MG PO CAPS: 30.0000 mg | ORAL_CAPSULE | Freq: Every day | ORAL | 0 refills | Status: DC

## 2016-04-03 NOTE — Telephone Encounter (Signed)
Lm on pts vm and informed her Rx os available for pickup from the front desk

## 2016-04-03 NOTE — Telephone Encounter (Signed)
Last f/u 01/2016 

## 2016-04-26 ENCOUNTER — Emergency Department (HOSPITAL_COMMUNITY)
Admission: EM | Admit: 2016-04-26 | Discharge: 2016-04-26 | Disposition: A | Discharge status: Home / Self Care | Payer: BLUE CROSS/BLUE SHIELD | Attending: Emergency Medicine | Admitting: Emergency Medicine

## 2016-04-26 ENCOUNTER — Encounter (HOSPITAL_COMMUNITY): Payer: Self-pay | Admitting: Emergency Medicine

## 2016-04-26 DIAGNOSIS — R42 Dizziness and giddiness: Secondary | ICD-10-CM | POA: Diagnosis not present

## 2016-04-26 DIAGNOSIS — R112 Nausea with vomiting, unspecified: Secondary | ICD-10-CM | POA: Insufficient documentation

## 2016-04-26 DIAGNOSIS — F909 Attention-deficit hyperactivity disorder, unspecified type: Secondary | ICD-10-CM | POA: Diagnosis not present

## 2016-04-26 DIAGNOSIS — Z79899 Other long term (current) drug therapy: Secondary | ICD-10-CM | POA: Insufficient documentation

## 2016-04-26 DIAGNOSIS — R110 Nausea: Secondary | ICD-10-CM

## 2016-04-26 DIAGNOSIS — Z791 Long term (current) use of non-steroidal anti-inflammatories (NSAID): Secondary | ICD-10-CM | POA: Insufficient documentation

## 2016-04-26 LAB — CBC
HEMATOCRIT: 44.3 % (ref 36.0–46.0)
HEMOGLOBIN: 14.5 g/dL (ref 12.0–15.0)
MCH: 30.1 pg (ref 26.0–34.0)
MCHC: 32.7 g/dL (ref 30.0–36.0)
MCV: 91.9 fL (ref 78.0–100.0)
Platelets: 259 10*3/uL (ref 150–400)
RBC: 4.82 MIL/uL (ref 3.87–5.11)
RDW: 12.6 % (ref 11.5–15.5)
WBC: 6.6 10*3/uL (ref 4.0–10.5)

## 2016-04-26 LAB — COMPREHENSIVE METABOLIC PANEL
ALBUMIN: 4.9 g/dL (ref 3.5–5.0)
ALT: 24 U/L (ref 14–54)
ANION GAP: 9 (ref 5–15)
AST: 23 U/L (ref 15–41)
Alkaline Phosphatase: 68 U/L (ref 38–126)
BILIRUBIN TOTAL: 0.4 mg/dL (ref 0.3–1.2)
BUN: 12 mg/dL (ref 6–20)
CO2: 27 mmol/L (ref 22–32)
Calcium: 10 mg/dL (ref 8.9–10.3)
Chloride: 101 mmol/L (ref 101–111)
Creatinine, Ser: 0.66 mg/dL (ref 0.44–1.00)
GFR calc non Af Amer: >60 mL/min (ref >60)
GLUCOSE: 108 mg/dL — AB (ref 65–99)
POTASSIUM: 3.6 mmol/L (ref 3.5–5.1)
SODIUM: 137 mmol/L (ref 135–145)
TOTAL PROTEIN: 8.7 g/dL — AB (ref 6.5–8.1)

## 2016-04-26 LAB — I-STAT BETA HCG BLOOD, ED (MC, WL, AP ONLY): I-stat hCG, quantitative: <5 m[IU]/mL (ref <5)

## 2016-04-26 LAB — URINALYSIS, ROUTINE W REFLEX MICROSCOPIC
Bilirubin Urine: NEGATIVE
Glucose, UA: NEGATIVE mg/dL
Hgb urine dipstick: NEGATIVE
Ketones, ur: NEGATIVE mg/dL
LEUKOCYTES UA: NEGATIVE
NITRITE: NEGATIVE
PH: 8 (ref 5.0–8.0)
Protein, ur: NEGATIVE mg/dL
SPECIFIC GRAVITY, URINE: 1.019 (ref 1.005–1.030)

## 2016-04-26 LAB — URINE MICROSCOPIC-ADD ON
Bacteria, UA: NONE SEEN
RBC / HPF: NONE SEEN RBC/hpf (ref 0–5)
WBC UA: NONE SEEN WBC/hpf (ref 0–5)

## 2016-04-26 LAB — CBG MONITORING, ED: GLUCOSE-CAPILLARY: 150 mg/dL — AB (ref 65–99)

## 2016-04-26 LAB — LIPASE, BLOOD: Lipase: 29 U/L (ref 11–51)

## 2016-04-26 MED ORDER — SODIUM CHLORIDE 0.9 % IV BOLUS (SEPSIS)
1000.0000 mL | Freq: Once | INTRAVENOUS | Status: AC
  Administered 2016-04-26: 1000 mL via INTRAVENOUS

## 2016-04-26 MED ORDER — ONDANSETRON HCL 4 MG/2ML IJ SOLN
4.0000 mg | Freq: Once | INTRAMUSCULAR | Status: AC
  Administered 2016-04-26: 4 mg via INTRAVENOUS
  Filled 2016-04-26: qty 2

## 2016-04-26 MED ORDER — ONDANSETRON HCL 4 MG PO TABS: 4.0000 mg | ORAL_TABLET | Freq: Four times a day (QID) | ORAL | 0 refills | Status: DC

## 2016-04-26 NOTE — ED Provider Notes (Signed)
[facility]-EMERGENCY DEPT Provider Note   CSN: [id number] Arrival date & time: 04/26/16  1238     History   Chief Complaint Chief Complaint  Patient presents with  . Dizziness  . Emesis    HPI Olivia Carrillo is a 25 y.o. female.  HPI   25 year old female presents today with complaints of nausea vomiting and dizziness. 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 She reports this sensation is similar to previous episodes when her blood sugar dropped. Patient notes that she often has to use heat first thing in the morning and several times throughout the day to avoid this feeling. She reports that she's had significant workup for hyper and hypoglycemia with no significant findings. She reports that any type of food including meats would improve her symptoms, and does not necessarily need carbohydrates. Patient reports symptoms have mildly improved, but still has some weights and nausea. Patient denies any headache but reports her head feels "heavy. She denies any neurological deficits, chest pain, shortness of breath, palpitations, abdominal pain or diarrhea. She denies any fevers at home.  History reviewed. No pertinent past medical history.  Patient Active Problem List   Diagnosis Date Noted  . Encounter for routine gynecological examination 01/31/2016  . Attention deficit hyperactivity disorder (ADHD) 12/18/2015  . Family history of breast cancer in female 10/11/2015  . Encounter for screening breast examination 10/11/2015  . Muscle spasm of back 08/19/2015  . Encounter for school history and physical examination 03/24/2014  . Family history of diabetes mellitus 02/10/2013  . History of syncope 02/10/2013    Past Surgical History:  Procedure Laterality Date  . TONSILLECTOMY AND ADENOIDECTOMY      OB History    No data available      Home Medications    Prior to Admission medications     Medication Sig Start Date End Date Taking? Authorizing Provider  ibuprofen (ADVIL,MOTRIN) 200 MG tablet Take 400 mg by mouth every 6 (six) hours as needed for moderate pain or cramping.   Yes Historical Provider, MD  lisdexamfetamine (VYVANSE) 30 MG capsule Take 1 capsule (30 mg total) by mouth daily. 04/03/16  Yes Dianne Dunalia M Aron, MD  ondansetron (ZOFRAN) 4 MG tablet Take 1 tablet (4 mg total) by mouth every 6 (six) hours. 04/26/16   Eyvonne MechanicJeffrey Roni Friberg, PA-C    Family History Family History  Problem Relation Age of Onset  . Diabetes Paternal Grandmother   . Hypertension Paternal Grandmother     Social History Social History  Substance Use Topics  . Smoking status: Never Smoker  . Smokeless tobacco: Not on file  . Alcohol use No    Allergies   Review of patient's allergies indicates no known allergies.   Review of Systems Review of Systems  All other systems reviewed and are negative.    Physical Exam Updated Vital Signs BP 116/83 (BP Location: Right Arm)   Pulse 97   Temp 98.1 F (36.7 C) (Oral)   Resp 16   SpO2 99%   Physical Exam  Constitutional: She is oriented to person, place, and time. She appears well-developed and well-nourished.  HENT:  Head: Normocephalic and atraumatic.  Eyes: Conjunctivae are normal. Pupils are equal, round, and reactive to light. Right eye exhibits no discharge. Left eye exhibits no discharge. No scleral icterus.  Neck: Normal range of motion. No JVD present. No tracheal deviation present.  Pulmonary/Chest: Effort normal.  No stridor.  Abdominal: Soft. She exhibits no distension.  Neurological: She is alert and oriented to person, place, and time. She has normal strength. No cranial nerve deficit or sensory deficit. Coordination normal. GCS eye subscore is 4. GCS verbal subscore is 5. GCS motor subscore is 6.  Psychiatric: She has a normal mood and affect. Her behavior is normal. Judgment and thought content normal.  Nursing note and vitals  reviewed.   ED Treatments / Results  Labs (all labs ordered are listed, but only abnormal results are displayed) Labs Reviewed  COMPREHENSIVE METABOLIC PANEL - Abnormal; Notable for the following:       Result Value   Glucose, Bld 108 (*)    Total Protein 8.7 (*)    All other components within normal limits  URINALYSIS, ROUTINE W REFLEX MICROSCOPIC (NOT [facility]) - Abnormal; Notable for the following:    APPearance TURBID (*)    All other components within normal limits  URINE MICROSCOPIC-ADD ON - Abnormal; Notable for the following:    Squamous Epithelial / LPF 0-5 (*)    All other components within normal limits  CBG MONITORING, ED - Abnormal; Notable for the following:    Glucose-Capillary 150 (*)    All other components within normal limits  LIPASE, BLOOD  CBC  I-STAT BETA HCG BLOOD, ED ([facility], [facility], [facility] ONLY)    EKG  EKG Interpretation None       Radiology No results found.  Procedures Procedures (including critical care time)  Medications Ordered in ED Medications  sodium chloride 0.9 % bolus 1,000 mL (0 mLs Intravenous Stopped 04/26/16 1542)  ondansetron (ZOFRAN) injection 4 mg (4 mg Intravenous Given 04/26/16 1342)     Initial Impression / Assessment and Plan / ED Course  I have reviewed the triage vital signs and the nursing notes.  Pertinent labs & imaging results that were available during my care of the patient were reviewed by me and considered in my medical decision making (see chart for details).  Clinical Course     Final Clinical Impressions(s) / ED Diagnoses   Final diagnoses:  Dizziness  Nausea    Labs:  Imaging:  Consults:  Therapeutics:  Discharge Meds:   Assessment/Plan:  25 year old female presents today with episode of nausea vomiting. Patient afebrile nontoxic in no acute distress. Patient reports she has this sensation frequently, improved with food. Patient was given fluids, antinausea medication here in the ED, she has no  abdominal pain neurological deficits, or any other concerning signs or symptoms to necessitate further evaluation or management here in the ED. Patient reports she is back to her baseline requesting discharge home. Patient's glucose is 108 here, due to her frequent episodes of what sounds like hypoglycemia with improvement with food she is instructed follow-up with her primary care first thing next week and schedule follow-up evaluation. Patient's symptoms are improved with any type of by mouth intake, and likely she is not suffering from hypoglycemia. Patient has had workup in the past with no significant findings. Patient verbalized understanding and agreement to today's plan had no further questions or concerns at time of discharge    New Prescriptions Discharge Medication List as of 04/26/2016  4:00 PM    START taking these medications   Details  ondansetron (ZOFRAN) 4 MG tablet Take 1 tablet (4 mg total) by mouth every 6 (six) hours., Starting Sat 04/26/2016, Print         Eyvonne Mechanic, PA-C 04/26/16 1630  Maia Plan, MD 04/26/16 2001

## 2016-04-26 NOTE — Discharge Instructions (Signed)
Please read attached information. If you experience any new or worsening signs or symptoms please return to the emergency room for evaluation. Please follow-up with your primary care provider or specialist as discussed. Please use medication prescribed only as directed and discontinue taking if you have any concerning signs or symptoms.   °

## 2016-04-26 NOTE — ED Triage Notes (Signed)
Pt reports dizziness at work today, attempted to continue working, became very lightheaded, fell slowly to ground. No LOC. Also c/o nausea and 7 episodes emesis in past hour. Hx concussion 4 years ago. Hx of similar episode, found to be hypoglycemic at that time.

## 2016-04-26 NOTE — ED Notes (Signed)
PT DISCHARGED. INSTRUCTIONS AND PRESCRIPTION GIVEN. AAOX4. PT IN NO APPARENT DISTRESS OR PAIN. THE OPPORTUNITY TO ASK QUESTIONS WAS PROVIDED. 

## 2016-04-28 ENCOUNTER — Encounter: Payer: Self-pay | Admitting: Family Medicine

## 2016-04-28 ENCOUNTER — Ambulatory Visit (INDEPENDENT_AMBULATORY_CARE_PROVIDER_SITE_OTHER): Payer: BLUE CROSS/BLUE SHIELD | Admitting: Family Medicine

## 2016-04-28 VITALS — BP 122/82 | HR 86 | Temp 97.9°F | Wt 172.0 lb

## 2016-04-28 DIAGNOSIS — R112 Nausea with vomiting, unspecified: Secondary | ICD-10-CM

## 2016-04-28 DIAGNOSIS — H8110 Benign paroxysmal vertigo, unspecified ear: Secondary | ICD-10-CM | POA: Insufficient documentation

## 2016-04-28 DIAGNOSIS — Z87898 Personal history of other specified conditions: Secondary | ICD-10-CM

## 2016-04-28 DIAGNOSIS — Z9189 Other specified personal risk factors, not elsewhere classified: Secondary | ICD-10-CM | POA: Diagnosis not present

## 2016-04-28 DIAGNOSIS — H8111 Benign paroxysmal vertigo, right ear: Secondary | ICD-10-CM

## 2016-04-28 MED ORDER — MECLIZINE HCL 25 MG PO TABS: 25.0000 mg | ORAL_TABLET | Freq: Three times a day (TID) | ORAL | 0 refills | Status: DC | PRN

## 2016-04-28 NOTE — Progress Notes (Signed)
Pre visit review using our clinic review tool, if applicable. No additional management support is needed unless otherwise documented below in the visit note. 

## 2016-04-28 NOTE — Patient Instructions (Signed)
Benign Positional Vertigo Vertigo is the feeling that you or your surroundings are moving when they are not. Benign positional vertigo is the most common form of vertigo. The cause of this condition is not serious (is benign). This condition is triggered by certain movements and positions (is positional). This condition can be dangerous if it occurs while you are doing something that could endanger you or others, such as driving.  CAUSES In many cases, the cause of this condition is not known. It may be caused by a disturbance in an area of the inner ear that helps your brain to sense movement and balance. This disturbance can be caused by a viral infection (labyrinthitis), head injury, or repetitive motion. RISK FACTORS This condition is more likely to develop in:  Women.  People who are 50 years of age or older. SYMPTOMS Symptoms of this condition usually happen when you move your head or your eyes in different directions. Symptoms may start suddenly, and they usually last for less than a minute. Symptoms may include:  Loss of balance and falling.  Feeling like you are spinning or moving.  Feeling like your surroundings are spinning or moving.  Nausea and vomiting.  Blurred vision.  Dizziness.  Involuntary eye movement (nystagmus). Symptoms can be mild and cause only slight annoyance, or they can be severe and interfere with daily life. Episodes of benign positional vertigo may return (recur) over time, and they may be triggered by certain movements. Symptoms may improve over time. DIAGNOSIS This condition is usually diagnosed by medical history and a physical exam of the head, neck, and ears. You may be referred to a health care provider who specializes in ear, nose, and throat (ENT) problems (otolaryngologist) or a provider who specializes in disorders of the nervous system (neurologist). You may have additional testing, including:  MRI.  A CT scan.  Eye movement tests. Your  health care provider may ask you to change positions quickly while he or she watches you for symptoms of benign positional vertigo, such as nystagmus. Eye movement may be tested with an electronystagmogram (ENG), caloric stimulation, the Dix-Hallpike test, or the roll test.  An electroencephalogram (EEG). This records electrical activity in your brain.  Hearing tests. TREATMENT Usually, your health care provider will treat this by moving your head in specific positions to adjust your inner ear back to normal. Surgery may be needed in severe cases, but this is rare. In some cases, benign positional vertigo may resolve on its own in 2-4 weeks. HOME CARE INSTRUCTIONS Safety  Move slowly.Avoid sudden body or head movements.  Avoid driving.  Avoid operating heavy machinery.  Avoid doing any tasks that would be dangerous to you or others if a vertigo episode would occur.  If you have trouble walking or keeping your balance, try using a cane for stability. If you feel dizzy or unstable, sit down right away.  Return to your normal activities as told by your health care provider. Ask your health care provider what activities are safe for you. General Instructions  Take over-the-counter and prescription medicines only as told by your health care provider.  Avoid certain positions or movements as told by your health care provider.  Drink enough fluid to keep your urine clear or pale yellow.  Keep all follow-up visits as told by your health care provider. This is important. SEEK MEDICAL CARE IF:  You have a fever.  Your condition gets worse or you develop new symptoms.  Your family or friends   notice any behavioral changes.  Your nausea or vomiting gets worse.  You have numbness or a "pins and needles" sensation. SEEK IMMEDIATE MEDICAL CARE IF:  You have difficulty speaking or moving.  You are always dizzy.  You faint.  You develop severe headaches.  You have weakness in your  legs or arms.  You have changes in your hearing or vision.  You develop a stiff neck.  You develop sensitivity to light.   This information is not intended to replace advice given to you by your health care provider. Make sure you discuss any questions you have with your health care provider.   Document Released: 05/05/2006 Document Revised: 04/18/2015 Document Reviewed: 11/20/2014 Elsevier Interactive Patient Education 2016 Elsevier Inc.  

## 2016-04-28 NOTE — Progress Notes (Signed)
Subjective:   Patient ID: Olivia Carrillo, female    DOB: February 24, 1991, 25 y.o.   MRN: [id number]  Olivia Carrillo is a pleasant 25 y.o. year old female who presents to clinic today with Hospitalization Follow-up  on 04/28/2016  HPI: ER follow up- seen in ER two days ago, 04/26/16. Notes reviewed.  Presented to ER with dizziness, nausea and vomiting.  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   bhcg neg CBC, UA, neg CMET unremarkable- glucose 108  Given IVFs and antinausea rxs in ER.  Advised to follow up with me as she reports frequent episodes of nausea episodes while driving or turning her head while playing video games.  No diarrhea or fever.  No visual changes.  Lab Results  Component Value Date   HGBA1C 5.2 02/10/2013   Current Outpatient Prescriptions on File Prior to Visit  Medication Sig Dispense Refill  . ibuprofen (ADVIL,MOTRIN) 200 MG tablet Take 400 mg by mouth every 6 (six) hours as needed for moderate pain or cramping.    . lisdexamfetamine (VYVANSE) 30 MG capsule Take 1 capsule (30 mg total) by mouth daily. 30 capsule 0  . ondansetron (ZOFRAN) 4 MG tablet Take 1 tablet (4 mg total) by mouth every 6 (six) hours. 12 tablet 0   No current facility-administered medications on file prior to visit.     No Known Allergies  No past medical history on file.  Past Surgical History:  Procedure Laterality Date  . TONSILLECTOMY AND ADENOIDECTOMY      Family History  Problem Relation Age of Onset  . Diabetes Paternal Grandmother   . Hypertension Paternal Grandmother     Social History   Social History  . Marital status: Single    Spouse name: N/A  . Number of children: N/A  . Years of education: N/A   Occupational History  . Not on file.   Social History Main Topics  . Smoking status: Never Smoker  . Smokeless tobacco: Not on file  . Alcohol use No  . Drug use: No  . Sexual  activity: Not on file   Other Topics Concern  . 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   The PMH, PSH, Social History, Family History, Medications, and allergies have been reviewed in [facility], and have been updated if relevant.   Review of Systems  Constitutional: Negative.   Eyes: Negative.   Respiratory: Negative.   Gastrointestinal: Positive for nausea and vomiting. Negative for abdominal distention, abdominal pain, anal bleeding, blood in stool, constipation, diarrhea and rectal pain.  Musculoskeletal: Negative.   Neurological: Positive for dizziness and headaches. Negative for tremors, seizures, syncope, facial asymmetry, speech difficulty, weakness, light-headedness and numbness.  Hematological: Negative.   All other systems reviewed and are negative.      Objective:    BP 122/82   Pulse 86   Temp 97.9 F (36.6 C) (Oral)   Wt 172 lb (78 kg)   LMP 04/10/2016   SpO2 97%   BMI 23.99 kg/m    Physical Exam  Constitutional: She is oriented to person, place, and time. She appears well-developed and well-nourished. No distress.  HENT:  Head: Normocephalic.  Cardiovascular: Normal rate.   Pulmonary/Chest: Effort normal.  Musculoskeletal: Normal range of motion.  Neurological: She is alert and oriented to person, place, and time.  + dix hall pike  right  Skin: Skin is warm and dry. She is not diaphoretic.  Psychiatric: She has a normal mood and affect. Her behavior is normal. Judgment and thought content normal.  Nursing note and vitals reviewed.         Assessment & Plan:   History of syncope  Nausea and vomiting, intractability of vomiting not specified, unspecified vomiting type  Benign paroxysmal positional vertigo, right - Plan: Ambulatory referral to Physical Therapy No Follow-up on file.

## 2016-04-28 NOTE — Assessment & Plan Note (Signed)
New- eRx sent for meclizine- discussed sedation precautions. Refer to PT for vestibular rehab. Consider imagining if symptoms persist. The patient indicates understanding of these issues and agrees with the plan.

## 2016-05-01 ENCOUNTER — Ambulatory Visit: Payer: BLUE CROSS/BLUE SHIELD | Admitting: Psychology

## 2016-05-15 ENCOUNTER — Ambulatory Visit
Payer: BLUE CROSS/BLUE SHIELD | Attending: Family Medicine | Admitting: Rehabilitative and Restorative Service Providers"

## 2016-06-29 ENCOUNTER — Other Ambulatory Visit: Payer: Self-pay | Admitting: Family Medicine

## 2016-06-30 MED ORDER — LISDEXAMFETAMINE DIMESYLATE 30 MG PO CAPS: 30.0000 mg | ORAL_CAPSULE | Freq: Every day | ORAL | 0 refills | Status: DC

## 2016-06-30 NOTE — Telephone Encounter (Signed)
Lm on pts vm and informed her Rx is available for pickup from the front desk 

## 2016-06-30 NOTE — Telephone Encounter (Signed)
Last f/u 01/2016 

## 2016-07-14 ENCOUNTER — Ambulatory Visit (INDEPENDENT_AMBULATORY_CARE_PROVIDER_SITE_OTHER): Payer: BLUE CROSS/BLUE SHIELD | Admitting: Family Medicine

## 2016-07-14 ENCOUNTER — Encounter: Payer: Self-pay | Admitting: Family Medicine

## 2016-07-14 VITALS — BP 110/60 | HR 99 | Temp 98.1°F | Wt 178.2 lb

## 2016-07-14 DIAGNOSIS — H01139 Eczematous dermatitis of unspecified eye, unspecified eyelid: Secondary | ICD-10-CM | POA: Insufficient documentation

## 2016-07-14 DIAGNOSIS — F900 Attention-deficit hyperactivity disorder, predominantly inattentive type: Secondary | ICD-10-CM | POA: Diagnosis not present

## 2016-07-14 DIAGNOSIS — H01136 Eczematous dermatitis of left eye, unspecified eyelid: Secondary | ICD-10-CM

## 2016-07-14 MED ORDER — TRIAMCINOLONE ACETONIDE 0.025 % EX OINT: 1.0000 "application " | TOPICAL_OINTMENT | Freq: Two times a day (BID) | CUTANEOUS | 0 refills | Status: DC

## 2016-07-14 MED ORDER — LISDEXAMFETAMINE DIMESYLATE 40 MG PO CAPS: 40.0000 mg | ORAL_CAPSULE | ORAL | 0 refills | Status: DC

## 2016-07-14 NOTE — Progress Notes (Signed)
Subjective:   Patient ID: Olivia Carrillo, female    DOB: [birth year], 25 y.o.   MRN: [id number]  Olivia NoeDestiny Jolly is a pleasant 25 y.o. year old female who presents to clinic today with itchy eyes; Dry Eye; and ADHD  on 07/14/2016  HPI:  ADHD- had formal evaluation and diagnosis (see note from 01/31/16).  Was on adderrall but did not like how it made her feel. On Vyvanse now which she does tolerate better but she would like to try to increase the dose as it does not seem to as effective as it was when she started it.  Dry eye lids- left > right.  Seems to only happen during winter month.  Gets itchy and irritated.  No blurred vision, red eye or eye pain.  Current Outpatient Prescriptions on File Prior to Visit  Medication Sig Dispense Refill  . ibuprofen (ADVIL,MOTRIN) 200 MG tablet Take 400 mg by mouth every 6 (six) hours as needed for moderate pain or cramping.    . meclizine (ANTIVERT) 25 MG tablet Take 1 tablet (25 mg total) by mouth 3 (three) times daily as needed for dizziness. 30 tablet 0  . ondansetron (ZOFRAN) 4 MG tablet Take 1 tablet (4 mg total) by mouth every 6 (six) hours. 12 tablet 0   No current facility-administered medications on file prior to visit.     No Known Allergies  No past medical history on file.  Past Surgical History:  Procedure Laterality Date  . TONSILLECTOMY AND ADENOIDECTOMY      Family History  Problem Relation Age of Onset  . Diabetes Paternal Grandmother   . Hypertension Paternal Grandmother     Social History   Social History  . Marital status: Single    Spouse name: N/A  . Number of children: N/A  . Years of education: N/A   Occupational History  . Not on file.   Social History Main Topics  . Smoking status: Never Smoker  . Smokeless tobacco: Not on file  . Alcohol use No  . Drug use: No  . Sexual activity: Not on file   Other Topics Concern  . 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   G0.   The PMH, PSH, Social History, Family History, Medications, and allergies have been [facility], and have been updated if relevant.   Review of Systems  Constitutional: Negative.   HENT: Negative.   Eyes: Negative.   Skin: Positive for rash.  Psychiatric/Behavioral: Positive for decreased concentration. Negative for dysphoric mood, hallucinations, self-injury, sleep disturbance and suicidal ideas. The patient is not nervous/anxious and is not hyperactive.   All other systems reviewed and are negative.      Objective:    BP 110/60   Pulse 99   Temp 98.1 F (36.7 C) (Oral)   Wt 178 lb 4 oz (80.9 kg)   SpO2 96%   BMI 24.86 kg/m    Physical Exam  Constitutional: She is oriented to person, place, and time. She appears well-developed and well-nourished. No distress.  HENT:  Head: Normocephalic.  Cardiovascular: Normal rate.   Pulmonary/Chest: Effort normal.  Musculoskeletal: Normal range of motion.  Neurological: She is alert and oriented to person, place, and time. No cranial nerve deficit.  Skin: She is not diaphoretic.     Psychiatric: She has a normal mood and affect. Her behavior is normal. Judgment and thought content normal.  Nursing note and  vitals reviewed.         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

## 2016-07-14 NOTE — Assessment & Plan Note (Signed)
Deteriorated. Will increase dose of vyvanse to 40 mg daily. Rx printed and given to pt. Call or return to clinic prn if these symptoms worsen or fail to improve as anticipated. The patient indicates understanding of these issues and agrees with the plan.

## 2016-07-14 NOTE — Assessment & Plan Note (Signed)
New- eczema does run in her family. eRx sent for triamcinolone to use twice daily for no more than 10 days- avoid contact with eyes. Call or return to clinic prn if these symptoms worsen or fail to improve as anticipated. The patient indicates understanding of these issues and agrees with the plan.

## 2016-07-14 NOTE — Progress Notes (Signed)
Pre visit review using our clinic review tool, if applicable. No additional management support is needed unless otherwise documented below in the visit note. 

## 2016-07-21 ENCOUNTER — Encounter: Payer: Self-pay | Admitting: Family Medicine

## 2016-07-22 ENCOUNTER — Other Ambulatory Visit: Payer: Self-pay | Admitting: Family Medicine

## 2016-07-22 DIAGNOSIS — H5789 Other specified disorders of eye and adnexa: Secondary | ICD-10-CM

## 2016-09-26 ENCOUNTER — Other Ambulatory Visit: Payer: Self-pay | Admitting: Family Medicine

## 2016-09-26 NOTE — Telephone Encounter (Signed)
Ok to print out and place on my desk for signature. 

## 2016-09-26 NOTE — Telephone Encounter (Signed)
Last f/u 07/2016 

## 2016-09-29 MED ORDER — LISDEXAMFETAMINE DIMESYLATE 40 MG PO CAPS: 40.0000 mg | ORAL_CAPSULE | ORAL | 0 refills | Status: DC

## 2016-09-29 NOTE — Telephone Encounter (Signed)
Lm on pts vm and informed her Rx is available for pickup from the front desk. Pt advised third party unable to pickup  

## 2016-10-03 ENCOUNTER — Encounter: Payer: Self-pay | Admitting: Family Medicine

## 2016-11-23 ENCOUNTER — Encounter: Payer: Self-pay | Admitting: Family Medicine

## 2016-12-03 ENCOUNTER — Encounter: Payer: Self-pay | Admitting: Family Medicine

## 2016-12-03 NOTE — Telephone Encounter (Signed)
Olivia Carrillo       12:21 PM  One of the names I believe was called dexmethophinadite

## 2016-12-12 ENCOUNTER — Encounter: Payer: Self-pay | Admitting: Family Medicine

## 2016-12-12 NOTE — Telephone Encounter (Signed)
See patient's email from 12/03/2016.

## 2016-12-17 ENCOUNTER — Ambulatory Visit: Payer: Self-pay | Admitting: Family Medicine

## 2016-12-22 ENCOUNTER — Ambulatory Visit (INDEPENDENT_AMBULATORY_CARE_PROVIDER_SITE_OTHER): Payer: BLUE CROSS/BLUE SHIELD | Admitting: Family Medicine

## 2016-12-22 ENCOUNTER — Encounter: Payer: Self-pay | Admitting: Family Medicine

## 2016-12-22 VITALS — BP 100/70 | HR 93 | Temp 98.5°F | Wt 185.0 lb

## 2016-12-22 DIAGNOSIS — F900 Attention-deficit hyperactivity disorder, predominantly inattentive type: Secondary | ICD-10-CM | POA: Diagnosis not present

## 2016-12-22 MED ORDER — DEXMETHYLPHENIDATE HCL ER 15 MG PO CP24: 15.0000 mg | ORAL_CAPSULE | Freq: Every day | ORAL | 0 refills | Status: DC

## 2016-12-22 NOTE — Assessment & Plan Note (Signed)
>  15 minutes spent in face to face time with patient, >50% spent in counselling or coordination of care Unfortunately vyvanse has become cost prohibitive for her. D/c vyvanse.  Rx for focalin printed and given to pt. Follow up in 1 month. The patient indicates understanding of these issues and agrees with the plan.

## 2016-12-22 NOTE — Progress Notes (Signed)
   Subjective:   Patient ID: Olivia Carrillo, female    DOB: Oct 13, 1990, 26 y.o.   MRN: [id number]  Olivia NoeDestiny Mountjoy is a pleasant 26 y.o. year old female who presents to clinic today with ADHD  on 12/22/2016  HPI:  ADHD- had formal evaluation and diagnosis (see note from 01/31/16).  Was on adderrall but did not like how it made her feel. On Vyvanse now which she does tolerate better but insurance will not longer cover it.  She called her insurance company and focalin is on the preferred formulary.  Current Outpatient Prescriptions on File Prior to Visit  Medication Sig Dispense Refill  . ibuprofen (ADVIL,MOTRIN) 200 MG tablet Take 400 mg by mouth every 6 (six) hours as needed for moderate pain or cramping.    . lisdexamfetamine (VYVANSE) 40 MG capsule Take 1 capsule (40 mg total) by mouth every morning. 30 capsule 0  . meclizine (ANTIVERT) 25 MG tablet Take 1 tablet (25 mg total) by mouth 3 (three) times daily as needed for dizziness. 30 tablet 0  . ondansetron (ZOFRAN) 4 MG tablet Take 1 tablet (4 mg total) by mouth every 6 (six) hours. 12 tablet 0  . triamcinolone (KENALOG) 0.025 % ointment Apply 1 application topically 2 (two) times daily. 30 g 0   No current facility-administered medications on file prior to visit.     No Known Allergies  No past medical history on file.  Past Surgical History:  Procedure Laterality Date  . TONSILLECTOMY AND ADENOIDECTOMY      Family History  Problem Relation Age of Onset  . Diabetes Paternal Grandmother   . Hypertension Paternal Grandmother     Social History   Social History  . Marital status: Single    Spouse name: N/A  . Number of children: N/A  . Years of education: N/A   Occupational History  . Not on file.   Social History Main Topics  . Smoking status: Never Smoker  . Smokeless tobacco: Never Used  . Alcohol use No  . Drug use: No  . Sexual activity: Not on file   Other Topics Concern  . 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   The PMH, PSH, Social History, Family History, Medications, and allergies have been reviewed [facility], and have been updated if relevant.   Review of Systems  Psychiatric/Behavioral: Negative.   All other systems reviewed and are negative.      Objective:    BP 100/70   Pulse 93   Temp 98.5 F (36.9 C)   Wt 185 lb (83.9 kg)   LMP 12/01/2016   SpO2 98%   BMI 25.80 kg/m    Physical Exam  Constitutional: She is oriented to person, place, and time. She appears well-developed and well-nourished. No distress.  HENT:  Head: Normocephalic and atraumatic.  Eyes: Conjunctivae are normal.  Cardiovascular: Normal rate.   Pulmonary/Chest: Effort normal.  Neurological: She is alert and oriented to person, place, and time. No cranial nerve deficit.  Skin: Skin is warm and dry. She is not diaphoretic.  Psychiatric: She has a normal mood and affect. Her behavior is normal. Judgment and thought content normal.  Nursing note and vitals reviewed.         Assessment & Plan:   Attention deficit hyperactivity disorder (ADHD), predominantly inattentive type No Follow-up on file.

## 2017-01-08 ENCOUNTER — Encounter: Payer: Self-pay | Admitting: Family Medicine

## 2017-01-15 ENCOUNTER — Encounter: Payer: Self-pay | Admitting: Family Medicine

## 2017-02-06 ENCOUNTER — Encounter: Payer: Self-pay | Admitting: Family Medicine

## 2017-03-25 ENCOUNTER — Encounter: Payer: Self-pay | Admitting: Family Medicine

## 2017-03-26 ENCOUNTER — Other Ambulatory Visit: Payer: Self-pay | Admitting: Family Medicine

## 2017-03-26 MED ORDER — AMPHETAMINE-DEXTROAMPHET ER 20 MG PO CP24: 20.0000 mg | ORAL_CAPSULE | ORAL | 0 refills | Status: AC

## 2017-05-08 ENCOUNTER — Ambulatory Visit (INDEPENDENT_AMBULATORY_CARE_PROVIDER_SITE_OTHER): Payer: BLUE CROSS/BLUE SHIELD | Admitting: Nurse Practitioner

## 2017-05-08 ENCOUNTER — Encounter: Payer: Self-pay | Admitting: Nurse Practitioner

## 2017-05-08 VITALS — BP 108/76 | HR 83 | Temp 98.5°F | Ht 71.0 in | Wt 185.0 lb

## 2017-05-08 DIAGNOSIS — H6123 Impacted cerumen, bilateral: Secondary | ICD-10-CM

## 2017-05-08 NOTE — Progress Notes (Signed)
Subjective:  Patient ID: Olivia Carrillo, female    DOB: Nov 16, 1990  Age: 26 y.o. MRN: [id number]  CC: Ear Fullness (both ears top up--going on for 1 wk. )   Ear Fullness   There is pain in both ears. This is a recurrent problem. The problem occurs constantly. The problem has been unchanged. There has been no fever. Pertinent negatives include no coughing, diarrhea, ear discharge, headaches, hearing loss, neck pain, rash, rhinorrhea or sore throat. She has tried ear drops for the symptoms. The treatment provided no relief.    Outpatient Medications Prior to Visit  Medication Sig Dispense Refill  . amphetamine-dextroamphetamine (ADDERALL XR) 20 MG 24 hr capsule Take 1 capsule (20 mg total) by mouth every morning. 30 capsule 0  . ibuprofen (ADVIL,MOTRIN) 200 MG tablet Take 400 mg by mouth every 6 (six) hours as needed for moderate pain or cramping.    . meclizine (ANTIVERT) 25 MG tablet Take 1 tablet (25 mg total) by mouth 3 (three) times daily as needed for dizziness. 30 tablet 0  . ondansetron (ZOFRAN) 4 MG tablet Take 1 tablet (4 mg total) by mouth every 6 (six) hours. 12 tablet 0  . triamcinolone (KENALOG) 0.025 % ointment Apply 1 application topically 2 (two) times daily. 30 g 0   No facility-administered medications prior to visit.     ROS See HPI  Objective:  BP 108/76   Pulse 83   Temp 98.5 F (36.9 C)   Ht  (1.803 m)   Wt 185 lb (83.9 kg)   SpO2 98%   BMI 25.80 kg/m   BP Readings from Last 3 Encounters:  05/08/17 108/76  12/22/16 100/70  07/14/16 110/60    Wt Readings from Last 3 Encounters:  05/08/17 185 lb (83.9 kg)  12/22/16 185 lb (83.9 kg)  07/14/16 178 lb 4 oz (80.9 kg)    Physical Exam  Constitutional: She is oriented to person, place, and time.  After ear irrigation  HENT:  Right Ear: Tympanic membrane, external ear and ear canal normal. No mastoid tenderness. No middle ear effusion.  Left Ear: Tympanic membrane, external ear and ear canal  normal. No mastoid tenderness.  No middle ear effusion.  Nose: Nose normal.  Mouth/Throat: Oropharynx is clear and moist. No oropharyngeal exudate.  Cardiovascular: Normal rate.   Pulmonary/Chest: Effort normal.  Lymphadenopathy:    She has no cervical adenopathy.  Neurological: She is alert and oriented to person, place, and time.  Vitals reviewed.  Procedure Note :     Procedure :  Ear irrigation (bilateral)   Indication:  Cerumen impaction   Risks, including pain, dizziness, eardrum perforation, bleeding, infection and others as well as benefits were explained to the patient in detail. Verbal consent was obtained and the patient agreed to proceed.    We used "The Elephant Ear Irrigation Device" filled with lukewarm water for irrigation. A large amount wax was recovered. Procedure has also required manual wax removal with an ear loop.   Tolerated well. Complications: None.   Postprocedure instructions :  Call if problems.  Lab Results  Component Value Date   WBC 6.6 04/26/2016   HGB 14.5 04/26/2016   HCT 44.3 04/26/2016   PLT 259 04/26/2016   GLUCOSE 108 (H) 04/26/2016   CHOL 227 (H) 02/10/2013   TRIG 211.0 (H) 02/10/2013   HDL 44.30 02/10/2013   LDLDIRECT 137.8 02/10/2013   ALT 24 04/26/2016   AST 23 04/26/2016   NA 137 04/26/2016  K 3.6 04/26/2016   CL 101 04/26/2016   CREATININE 0.66 04/26/2016   BUN 12 04/26/2016   CO2 27 04/26/2016   TSH 3.97 02/10/2013   HGBA1C 5.2 02/10/2013    No results found.  Assessment & Plan:   Casie was seen today for ear fullness.  Diagnoses and all orders for this visit:  Bilateral impacted cerumen   I am having Ms. Younes maintain her ibuprofen, ondansetron, meclizine, triamcinolone, and amphetamine-dextroamphetamine.  No orders of the defined types were placed in this encounter.   Follow-up: Return if symptoms worsen or fail to improve.  Alysia Penna, NP

## 2017-05-29 ENCOUNTER — Ambulatory Visit (INDEPENDENT_AMBULATORY_CARE_PROVIDER_SITE_OTHER): Payer: BLUE CROSS/BLUE SHIELD | Admitting: Family Medicine

## 2017-05-29 ENCOUNTER — Encounter: Payer: Self-pay | Admitting: Family Medicine

## 2017-05-29 VITALS — BP 102/68 | HR 91 | Temp 97.9°F | Wt 185.0 lb

## 2017-05-29 DIAGNOSIS — R0781 Pleurodynia: Secondary | ICD-10-CM

## 2017-05-29 DIAGNOSIS — K219 Gastro-esophageal reflux disease without esophagitis: Secondary | ICD-10-CM | POA: Diagnosis not present

## 2017-05-29 DIAGNOSIS — Z23 Encounter for immunization: Secondary | ICD-10-CM | POA: Diagnosis not present

## 2017-05-29 MED ORDER — OMEPRAZOLE 20 MG PO CPDR: 20.0000 mg | DELAYED_RELEASE_CAPSULE | Freq: Every day | ORAL | 2 refills | Status: DC

## 2017-05-29 NOTE — Progress Notes (Signed)
   Subjective:    Patient ID: Olivia Carrillo, female    DOB: 05-May-1991, 26 y.o.   MRN: [id number]  HPI This is a 26 yo female who presents today with heartburn x 3 weeks. Comes and goes. Some relief with Tums. Happens all day, worse over last two days. Tried 14 days of daily Nexium with good relief. No relief with ranitidine. Finished Nexium day before symptoms returned. Has burning, knot in throat, feels like jabbing in her throat. Some nausea. No particular food triggers. Rarely eats fried or fast food. No particular pattern. Excessive burping. No abdominal pain, no diarrhea or constipation. Feels a little bloated. No caffeine, no ETOH. Mother has bad GERD.   Has noticed some left sided pain over last several days, over lateral ribs. No fever/chills, no dysuria, no frequency.   No past medical history on file. Past Surgical History:  Procedure Laterality Date  . TONSILLECTOMY AND ADENOIDECTOMY     Family History  Problem Relation Age of Onset  . Diabetes Paternal Grandmother   . Hypertension Paternal Grandmother    Social History  Substance Use Topics  . Smoking status: Never Smoker  . Smokeless tobacco: Never Used  . Alcohol use No      Review of Systems Per HPI    Objective:   Physical Exam  Constitutional: She is oriented to person, place, and time. She appears well-developed and well-nourished. No distress.  HENT:  Head: Normocephalic and atraumatic.  Eyes: Conjunctivae are normal.  Neck: Normal range of motion. Neck supple.  Cardiovascular: Normal rate, regular rhythm and normal heart sounds.   Pulmonary/Chest: Effort normal and breath sounds normal.  Abdominal: Soft. Bowel sounds are normal. She exhibits no distension. There is no tenderness. There is no rebound, no guarding and no CVA tenderness.  Musculoskeletal: She exhibits tenderness (left lateral ribcage, no rash, erythema or bruising. ).  Neurological: She is alert and oriented to person, place, and time.    Skin: Skin is warm and dry. She is not diaphoretic.  Psychiatric: She has a normal mood and affect. Her behavior is normal. Judgment and thought content normal.  Vitals reviewed.     BP 102/68 (BP Location: Left Arm, Patient Position: Sitting, Cuff Size: Normal)   Pulse 91   Temp 97.9 F (36.6 C) (Oral)   Wt 185 lb (83.9 kg)   LMP 05/04/2017   SpO2 99%   BMI 25.80 kg/m      Assessment & Plan:  1. Gastroesophageal reflux disease, esophagitis presence not specified - Provided written and verbal information regarding diagnosis and treatment. - follow up in 2 months, sooner if no improvement or if symptoms worsen - omeprazole (PRILOSEC) 20 MG capsule; Take 1 capsule (20 mg total) by mouth daily.  Dispense: 30 capsule; Refill: 2  2. Rib pain - musculoskeletal, suggested acetaminophen/heat  3. Need for influenza vaccination - Flu Vaccine QUAD 6+ mos PF IM (Fluarix Quad PF)   Olean Reeeborah Cadynce Garrette, FNP-BC  [place] [facility], [facility]  05/29/2017 5:39 PM

## 2017-05-29 NOTE — Patient Instructions (Signed)
Please follow up in 2 months  Heartburn Heartburn is a type of pain or discomfort that can happen in the throat or chest. It is often described as a burning pain. It may also cause a bad taste in the mouth. Heartburn may feel worse when you lie down or bend over, and it is often worse at night. Heartburn may be caused by stomach contents that move back up into the esophagus (reflux). Follow these instructions at home: Take these actions to decrease your discomfort and to help avoid complications. Diet  Follow a diet as recommended by your health care provider. This may involve avoiding foods and drinks such as: ? Coffee and tea (with or without caffeine). ? Drinks that contain alcohol. ? Energy drinks and sports drinks. ? Carbonated drinks or sodas. ? Chocolate and cocoa. ? Peppermint and mint flavorings. ? Garlic and onions. ? Horseradish. ? Spicy and acidic foods, including peppers, chili powder, curry powder, vinegar, hot sauces, and barbecue sauce. ? Citrus fruit juices and citrus fruits, such as oranges, lemons, and limes. ? Tomato-based foods, such as red sauce, chili, salsa, and pizza with red sauce. ? Fried and fatty foods, such as donuts, french fries, potato chips, and high-fat dressings. ? High-fat meats, such as hot dogs and fatty cuts of red and white meats, such as rib eye steak, sausage, ham, and bacon. ? High-fat dairy items, such as whole milk, butter, and cream cheese.  Eat small, frequent meals instead of large meals.  Avoid drinking large amounts of liquid with your meals.  Avoid eating meals during the 2-3 hours before bedtime.  Avoid lying down right after you eat.  Do not exercise right after you eat. General instructions  Pay attention to any changes in your symptoms.  Take over-the-counter and prescription medicines only as told by your health care provider. Do not take aspirin, ibuprofen, or other NSAIDs unless your health care provider told you to do  so.  Do not use any tobacco products, including cigarettes, chewing tobacco, and e-cigarettes. If you need help quitting, ask your health care provider.  Wear loose-fitting clothing. Do not wear anything tight around your waist that causes pressure on your abdomen.  Raise (elevate) the head of your bed about 6 inches (15 cm).  Try to reduce your stress, such as with yoga or meditation. If you need help reducing stress, ask your health care provider.  If you are overweight, reduce your weight to an amount that is healthy for you. Ask your health care provider for guidance about a safe weight loss goal.  Keep all follow-up visits as told by your health care provider. This is important. Contact a health care provider if:  You have new symptoms.  You have unexplained weight loss.  You have difficulty swallowing, or it hurts to swallow.  You have wheezing or a persistent cough.  Your symptoms do not improve with treatment.  You have frequent heartburn for more than two weeks. Get help right away if:  You have pain in your arms, neck, jaw, teeth, or back.  You feel sweaty, dizzy, or light-headed.  You have chest pain or shortness of breath.  You vomit and your vomit looks like blood or coffee grounds.  Your stool is bloody or black. This information is not intended to replace advice given to you by your health care provider. Make sure you discuss any questions you have with your health care provider. Document Released: 12/14/2008 Document Revised: 01/03/2016 Document Reviewed: 11/22/2014  Elsevier Interactive Patient Education  2017 [employer]vier Inc[employer]  

## 2017-06-02 ENCOUNTER — Ambulatory Visit: Payer: Self-pay | Admitting: Family Medicine

## 2017-06-05 ENCOUNTER — Encounter: Payer: Self-pay | Admitting: Family Medicine

## 2017-06-05 ENCOUNTER — Ambulatory Visit (INDEPENDENT_AMBULATORY_CARE_PROVIDER_SITE_OTHER): Payer: BLUE CROSS/BLUE SHIELD | Admitting: Family Medicine

## 2017-06-05 VITALS — Temp 98.3°F | Wt 183.5 lb

## 2017-06-05 DIAGNOSIS — R110 Nausea: Secondary | ICD-10-CM | POA: Diagnosis not present

## 2017-06-05 DIAGNOSIS — K219 Gastro-esophageal reflux disease without esophagitis: Secondary | ICD-10-CM

## 2017-06-05 MED ORDER — ONDANSETRON HCL 4 MG PO TABS: 4.0000 mg | ORAL_TABLET | Freq: Four times a day (QID) | ORAL | 0 refills | Status: DC

## 2017-06-05 NOTE — Progress Notes (Signed)
   Subjective:    Patient ID: Olivia Carrillo, female    DOB: [birth year], 26 y.o.   MRN: [id number]  HPI This is a 26 yo female who presents today with continued acid indigestion. Was seen 05/29/17 and prescribed omeprazole 20 mg. Has been taking at around 8 am. Has been watching diet. Day after she was here, woke up with vomiting. Was ok for several days then started having additional heartburn in the evening. Feeling very nauseous. Has tried to elevate her head. Currently with burning in chest. Has increased stress with school. No constipation, some loose stools last week. Not eating several hours prior to going to bed.     No past medical history on file. Past Surgical History:  Procedure Laterality Date  . TONSILLECTOMY AND ADENOIDECTOMY     Family History  Problem Relation Age of Onset  . Diabetes Paternal Grandmother   . Hypertension Paternal Grandmother    Social History  Substance Use Topics  . Smoking status: Never Smoker  . Smokeless tobacco: Never Used  . Alcohol use No      Review of Systems Per HPI    Objective:   Physical Exam Physical Exam  Vitals reviewed. Constitutional: Oriented to person, place, and time. Appears well-developed and well-nourished.  HENT:  Head: Normocephalic and atraumatic.  Eyes: Conjunctivae are normal.  Neck: Normal range of motion. Neck supple.  Cardiovascular: Normal rate.   Pulmonary/Chest: Effort normal.  Musculoskeletal: Normal range of motion.  Neurological: Alert and oriented to person, place, and time.  Skin: Skin is warm and dry.  Psychiatric: Normal mood and affect. Behavior is normal. Judgment and thought content normal.   .  Temp 98.3 F (36.8 C) (Oral)   Wt 183 lb 8 oz (83.2 kg)   LMP 04/25/2017   SpO2 99%   BMI 25.59 kg/m  BP Readings from Last 3 Encounters:  05/29/17 102/68  05/08/17 108/76  12/22/16 100/70   Wt Readings from Last 3 Encounters:  06/05/17 183 lb 8 oz (83.2 kg)  05/29/17 185 lb (83.9 kg)    05/08/17 185 lb (83.9 kg)    Assessment & Plan:  1. Gastroesophageal reflux disease, esophagitis presence not specified - has had some better days, unknown triggers.  - will have her take omeprazole with dinner and can use Mylicon or Gaviscon for break through symptoms - keep symptom log  2. Nausea - ondansetron (ZOFRAN) 4 MG tablet; Take 1 tablet (4 mg total) by mouth every 6 (six) hours.  Dispense: 12 tablet; Refill: 0 - follow up in 5-7 days if no improvement  Olean Reeeborah Wade Asebedo, FNP-BC   [facility], [facility]  06/05/2017 2:28 PM

## 2017-06-05 NOTE — Patient Instructions (Addendum)
Try taking your omeprazole with dinner.  For breakthrough symptoms, can take Mylanta or Gaviscon (generic is fine) Keep a symptom log

## 2017-06-29 ENCOUNTER — Encounter: Payer: Self-pay | Admitting: Family Medicine

## 2017-06-29 ENCOUNTER — Ambulatory Visit: Payer: BLUE CROSS/BLUE SHIELD | Admitting: Family Medicine

## 2017-06-29 VITALS — BP 118/74 | HR 97 | Temp 98.5°F | Ht 71.0 in | Wt 177.0 lb

## 2017-06-29 DIAGNOSIS — M549 Dorsalgia, unspecified: Secondary | ICD-10-CM | POA: Diagnosis not present

## 2017-06-29 MED ORDER — CYCLOBENZAPRINE HCL 5 MG PO TABS: 5.0000 mg | ORAL_TABLET | Freq: Every day | ORAL | 0 refills | Status: AC

## 2017-06-29 NOTE — Progress Notes (Signed)
SUBJECTIVE:  Olivia Carrillo is a 26 y.o. female who complains of low back pain for 2 week(s), positional with bending or lifting, without radiation down the legs. Precipitating factors: none recalled by the patient. Prior history of back problems: recurrent self limited episodes of low back pain in the past. There is no numbness in the legs.  Current Outpatient Medications on File Prior to Visit  Medication Sig Dispense Refill  . amphetamine-dextroamphetamine (ADDERALL XR) 20 MG 24 hr capsule Take 1 capsule (20 mg total) by mouth every morning. 30 capsule 0  . omeprazole (PRILOSEC) 20 MG capsule Take 1 capsule (20 mg total) by mouth daily. 30 capsule 2   No current facility-administered medications on file prior to visit.     No Known Allergies  No past medical history on file.  Past Surgical History:  Procedure Laterality Date  . TONSILLECTOMY AND ADENOIDECTOMY      Family History  Problem Relation Age of Onset  . Diabetes Paternal Grandmother   . Hypertension Paternal Grandmother     Social History   Socioeconomic History  . Marital status: Single    Spouse name: Not on file  . Number of children: Not on file  . Years of education: Not on file  . Highest education level: Not on file  Social Needs  . Financial resource strain: Not on file  . Food insecurity - worry: Not on file  . Food insecurity - inability: Not on file  . Transportation needs - medical: Not on file  . Transportation needs - non-medical: Not on file  Occupational History  . Not on file  Tobacco Use  . Smoking status: Never Smoker  . Smokeless tobacco: Never Used  Substance and Sexual Activity  . Alcohol use: No    Alcohol/week: 0.0 oz  . Drug use: No  . Sexual activity: Not on file  Other Topics Concern  . 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   The PMH, PSH, Social History, Family History, Medications, and allergies have been reviewed  [facility], and have been updated if relevant.  OBJECTIVE: BP 118/74 (BP Location: Left Arm, Patient Position: Sitting, Cuff Size: Normal)   Pulse 97   Temp 98.5 F (36.9 C) (Oral)   Ht 5\' 11"  (1.803 m)   Wt 177 lb (80.3 kg)   SpO2 98%   BMI 24.69 kg/m   Patient appears to be in mild to moderate pain, antalgic gait noted. Lumbosacral spine area reveals no local tenderness or mass.  Painful and reduced LS ROM noted. Straight leg raise is negative at 45 degrees on bilateral. DTR's, motor strength and sensation normal, including heel and toe gait.  Peripheral pulses are palpable. X-Ray: not available.  ASSESSMENT:  lumbar strain  PLAN: For acute pain, rest, intermittent application of heat (do not sleep on heating pad), analgesics and muscle relaxants are recommended. Discussed longer term treatment plan of prn NSAID's and discussed a home back care exercise program with flexion exercise routine. Proper lifting with avoidance of heavy lifting discussed. Consider Physical Therapy and XRay studies if not improving. Call or return to clinic prn if these symptoms worsen or fail to improve as anticipated.

## 2017-06-29 NOTE — Patient Instructions (Signed)
Great to see you. Hang in there.  Take flexeril 5 mg nightly for the next few nights.  Heating pads help too.

## 2017-08-18 ENCOUNTER — Ambulatory Visit: Payer: BLUE CROSS/BLUE SHIELD | Admitting: Psychology

## 2017-08-19 ENCOUNTER — Ambulatory Visit: Payer: Self-pay | Admitting: Family Medicine

## 2017-08-26 ENCOUNTER — Other Ambulatory Visit: Payer: Self-pay | Admitting: Family Medicine

## 2017-08-26 DIAGNOSIS — K219 Gastro-esophageal reflux disease without esophagitis: Secondary | ICD-10-CM

## 2017-09-16 ENCOUNTER — Other Ambulatory Visit: Payer: Self-pay

## 2017-09-16 ENCOUNTER — Encounter: Payer: Self-pay | Admitting: Family Medicine

## 2017-09-16 DIAGNOSIS — K219 Gastro-esophageal reflux disease without esophagitis: Secondary | ICD-10-CM

## 2017-09-16 MED ORDER — OMEPRAZOLE 20 MG PO CPDR: 20.0000 mg | DELAYED_RELEASE_CAPSULE | Freq: Every day | ORAL | 0 refills | Status: DC

## 2017-09-22 ENCOUNTER — Ambulatory Visit: Payer: Self-pay | Admitting: Family Medicine

## 2017-09-22 ENCOUNTER — Encounter: Payer: Self-pay | Admitting: Family Medicine

## 2017-09-22 VITALS — BP 104/64 | HR 112 | Temp 99.1°F | Ht 71.0 in | Wt 179.0 lb

## 2017-09-22 DIAGNOSIS — S161XXA Strain of muscle, fascia and tendon at neck level, initial encounter: Secondary | ICD-10-CM

## 2017-09-22 NOTE — Progress Notes (Signed)
SUBJECTIVE:  Olivia Carrillo is a 27 y.o. female who complains of neck pain for 2-3 month(s) ago. The pain is positional with movement of neck without radiation of pain down the arms. Mechanism of injury: no known injury.  Symptoms have been constant since that time. Prior history of neck problems: no prior neck problems. There is no numbness, tingling, weakness in the arms.  Flexeril has not helped.  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 Current Outpatient Medications on File Prior to Visit  Medication Sig Dispense Refill  . amphetamine-dextroamphetamine (ADDERALL XR) 20 MG 24 hr capsule Take 1 capsule (20 mg total) by mouth every morning. 30 capsule 0  . cyclobenzaprine (FLEXERIL) 5 MG tablet Take 1 tablet (5 mg total) at bedtime by mouth. 30 tablet 0  . omeprazole (PRILOSEC) 20 MG capsule Take 1 capsule (20 mg total) by mouth daily. 90 capsule 0   No current facility-administered medications on file prior to visit.     No Known Allergies  No past medical history on file.  Past Surgical History:  Procedure Laterality Date  . TONSILLECTOMY AND ADENOIDECTOMY      Family History  Problem Relation Age of Onset  . Diabetes Paternal Grandmother   . Hypertension Paternal Grandmother     Social History   Socioeconomic History  . Marital status: Single    Spouse name: Not on file  . Number of children: Not on file  . Years of education: Not on file  . Highest education level: Not on file  Social Needs  . Financial resource strain: Not on file  . Food insecurity - worry: Not on file  . Food insecurity - inability: Not on file  . Transportation needs - medical: Not on file  . Transportation needs - non-medical: Not on file  Occupational History  . Not on file  Tobacco Use  . Smoking status: Never Smoker  . Smokeless tobacco: Never Used  Substance and Sexual Activity  . Alcohol use: No    Alcohol/week: 0.0 oz   . Drug use: No  . Sexual activity: Not on file  Other Topics Concern  . 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   The PMH, PSH, Social History, Family History, Medications, and allergies [facility] been [facility], and have been updated if relevant.  OBJECTIVE: BP 104/64 (BP Location: Left Arm, Patient Position: Sitting, Cuff Size: Normal)   Pulse (!) 112   Temp 99.1 F (37.3 C) (Oral)   Ht 5\' 11"  (1.803 m)   Wt 179 lb (81.2 kg)   LMP 08/24/2017   SpO2 98%   BMI 24.97 kg/m   Vital signs as noted above. Patient appears to be in mild to moderate pain.  Neck exam: tenderness over lower cervical spine and nuchal area, normal neurological exam of arms; normal DTR's, motor, sensory exam. X-Ray: not indicated.  ASSESSMENT:  cervical strain  PLAN:rest the injured area as much as practical, advised applying for another position at work that would require less continuous neck flexion. Consider Physical Therapy and XRay studies if not improving.  Call or return to clinic prn if these symptoms worsen or fail to improve as anticipated.

## 2017-11-04 ENCOUNTER — Other Ambulatory Visit: Payer: Self-pay | Admitting: Family Medicine

## 2017-11-04 ENCOUNTER — Encounter: Payer: Self-pay | Admitting: Family Medicine

## 2017-11-04 MED ORDER — TRIAMCINOLONE ACETONIDE 0.025 % EX OINT: 1.0000 "application " | TOPICAL_OINTMENT | Freq: Two times a day (BID) | CUTANEOUS | 0 refills | Status: AC

## 2017-12-12 ENCOUNTER — Other Ambulatory Visit: Payer: Self-pay | Admitting: Family Medicine

## 2017-12-12 DIAGNOSIS — K219 Gastro-esophageal reflux disease without esophagitis: Secondary | ICD-10-CM

## 2018-03-06 ENCOUNTER — Encounter: Payer: Self-pay | Admitting: Family Medicine

## 2018-03-10 ENCOUNTER — Encounter: Payer: Self-pay | Admitting: Family Medicine

## 2018-03-10 ENCOUNTER — Ambulatory Visit: Payer: BLUE CROSS/BLUE SHIELD | Admitting: Family Medicine

## 2018-03-10 VITALS — BP 110/74 | HR 95 | Temp 98.2°F | Ht 71.0 in | Wt 176.0 lb

## 2018-03-10 DIAGNOSIS — R1030 Lower abdominal pain, unspecified: Secondary | ICD-10-CM

## 2018-03-10 DIAGNOSIS — R1032 Left lower quadrant pain: Secondary | ICD-10-CM | POA: Insufficient documentation

## 2018-03-10 LAB — URINALYSIS, ROUTINE W REFLEX MICROSCOPIC
Bilirubin Urine: NEGATIVE
Hgb urine dipstick: NEGATIVE
Ketones, ur: NEGATIVE
LEUKOCYTES UA: NEGATIVE
Nitrite: NEGATIVE
PH: 5.5 (ref 5.0–8.0)
SPECIFIC GRAVITY, URINE: 1.025 (ref 1.000–1.030)
TOTAL PROTEIN, URINE-UPE24: NEGATIVE
URINE GLUCOSE: NEGATIVE
UROBILINOGEN UA: 0.2 (ref 0.0–1.0)

## 2018-03-10 LAB — COMPREHENSIVE METABOLIC PANEL
ALK PHOS: 60 U/L (ref 39–117)
ALT: 16 U/L (ref 0–35)
AST: 15 U/L (ref 0–37)
Albumin: 4.8 g/dL (ref 3.5–5.2)
BILIRUBIN TOTAL: 0.3 mg/dL (ref 0.2–1.2)
BUN: 13 mg/dL (ref 6–23)
CALCIUM: 10.1 mg/dL (ref 8.4–10.5)
CO2: 29 meq/L (ref 19–32)
CREATININE: 0.82 mg/dL (ref 0.40–1.20)
Chloride: 103 mEq/L (ref 96–112)
GFR: 88.68 mL/min (ref >60.00)
GLUCOSE: 123 mg/dL — AB (ref 70–99)
Potassium: 4.7 mEq/L (ref 3.5–5.1)
Sodium: 140 mEq/L (ref 135–145)
TOTAL PROTEIN: 7.8 g/dL (ref 6.0–8.3)

## 2018-03-10 LAB — CBC
HCT: 41.4 % (ref 36.0–46.0)
Hemoglobin: 13.8 g/dL (ref 12.0–15.0)
MCHC: 33.3 g/dL (ref 30.0–36.0)
MCV: 93 fl (ref 78.0–100.0)
Platelets: 232 10*3/uL (ref 150.0–400.0)
RBC: 4.45 Mil/uL (ref 3.87–5.11)
RDW: 12.9 % (ref 11.5–15.5)
WBC: 6.1 10*3/uL (ref 4.0–10.5)

## 2018-03-10 LAB — LIPASE: LIPASE: 38 U/L (ref 11.0–59.0)

## 2018-03-10 NOTE — Assessment & Plan Note (Signed)
New- persistent- unclear etiology, ongoing for 10 months intermittently. ? Renal stones? Will get labs, renal stone CT, UA. The patient indicates understanding of these issues and agrees with the plan. Orders Placed This Encounter  Procedures  . CT RENAL STONE STUDY  . Lipase  . Comprehensive metabolic panel  . CBC  . Urinalysis, Routine w reflex microscopic

## 2018-03-10 NOTE — Progress Notes (Signed)
Subjective:   Patient ID: Olivia Carrillo, female    DOB: [birth year], 27 y.o.   MRN: [id number]  Olivia Carrillo is a pleasant 27 y.o. year old female who presents to clinic today with Abdominal Pain (Patient is here today C/O left lumbar region abdominal pain. Has had nausea but no vomiting.  Pain has been intermittent for a couple of months now.  If she leans on her right side it causes pain in the left side.  Denies any urinary or bowel changes.  Drinks mostly  water.  States that in October when she was seen for GERD and NP was palpating abd she said it was tender then.)  on 03/10/2018  HPI:  Flank pain/ Left lower abdominal pain.  Intermittent for months but can be quite severe- 7/10 when she has it.  When she lays on her right side, often causes pain in her left side.  Does move- up and down left side, to the back.  No dysuria or gross hematuria. No nausea, vomiting or changes in her bowel habits.  No fevers.  Not pregnant- not sexually active with men.  Current Outpatient Medications on File Prior to Visit  Medication Sig Dispense Refill  . amphetamine-dextroamphetamine (ADDERALL XR) 20 MG 24 hr capsule Take 1 capsule (20 mg total) by mouth every morning. 30 capsule 0  . cyclobenzaprine (FLEXERIL) 5 MG tablet Take 1 tablet (5 mg total) at bedtime by mouth. 30 tablet 0  . omeprazole (PRILOSEC) 20 MG capsule TAKE 1 CAPSULE BY MOUTH EVERY DAY 90 capsule 1  . triamcinolone (KENALOG) 0.025 % ointment Apply 1 application topically 2 (two) times daily. 30 g 0   No current facility-administered medications on file prior to visit.     No Known Allergies  No past medical history on file.  Past Surgical History:  Procedure Laterality Date  . TONSILLECTOMY AND ADENOIDECTOMY      Family History  Problem Relation Age of Onset  . Diabetes Paternal Grandmother   . Hypertension Paternal Grandmother     Social History   Socioeconomic History  . Marital status: Single    Spouse name:  Not on file  . Number of children: Not on file  . Years of education: Not on file  . Highest education level: Not on file  Occupational History  . Not on file  Social Needs  . Financial resource strain: Not on file  . Food insecurity:    Worry: Not on file    Inability: Not on file  . Transportation needs:    Medical: Not on file    Non-medical: Not on file  Tobacco Use  . Smoking status: Never Smoker  . Smokeless tobacco: Never Used  Substance and Sexual Activity  . Alcohol use: No    Alcohol/week: 0.0 oz  . Drug use: No  . Sexual activity: Not on file  Lifestyle  . Physical activity:    Days per week: Not on file    Minutes per session: Not on file  . Stress: Not on file  Relationships  . Social connections:    Talks on phone: Not on file    Gets together: Not on file    Attends religious service: Not on file    Active member of club or organization: Not on file    Attends meetings of clubs or organizations: Not on file    Relationship status: Not on file  . Intimate partner violence:    Fear of current or ex  partner: Not on file    Emotionally abused: Not on file    Physically abused: Not on file    Forced sexual activity: Not on file  Other Topics Concern  . 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   The PMH, PSH, Social History, Family History, Medications, and allergies [facility], and have been updated if relevant.   Review of Systems  Constitutional: Negative.   Eyes: Negative.   Respiratory: Negative.   Gastrointestinal: Positive for abdominal pain. Negative for abdominal distention, anal bleeding, blood in stool, constipation, diarrhea, nausea, rectal pain and vomiting.  Endocrine: Negative.   Genitourinary: Negative.   Musculoskeletal: Positive for back pain.  Skin: Negative.   Allergic/Immunologic: Negative.   Neurological: Negative.   Hematological: Negative.     Psychiatric/Behavioral: Negative.   All other systems reviewed and are negative.      Objective:    BP 110/74 (BP Location: Left Arm, Patient Position: Sitting, Cuff Size: Normal)   Pulse 95   Temp 98.2 F (36.8 C) (Oral)   Ht 5\' 11"  (1.803 m)   Wt 176 lb (79.8 kg)   LMP 02/12/2018 (Exact Date)   SpO2 98%   BMI 24.55 kg/m    Physical Exam  Constitutional: She appears well-developed and well-nourished.  Non-toxic appearance. She does not appear ill.  HENT:  Head: Normocephalic and atraumatic.  Eyes: EOM are normal.  Cardiovascular: Normal rate.  Pulmonary/Chest: Effort normal.  Abdominal: Normal appearance. She exhibits no shifting dullness, no distension, no pulsatile liver, no fluid wave, no abdominal bruit, no ascites, no pulsatile midline mass and no mass. Bowel sounds are increased. There is no hepatosplenomegaly or hepatomegaly. There is tenderness in the right lower quadrant. There is guarding. There is no rigidity, no rebound, no CVA tenderness, no tenderness at McBurney's point and negative Murphy's sign. No hernia.  Neurological: She is alert.  Skin: Skin is warm and dry.  Psychiatric: She has a normal mood and affect.  Nursing note and vitals reviewed.         Assessment & Plan:   Abdominal pain, left lower quadrant - Plan: Lipase, Comprehensive metabolic panel, CBC, Urinalysis, Routine w reflex microscopic  Lower abdominal pain - Plan: CBC, CT RENAL STONE STUDY, Urinalysis, Routine w reflex microscopic No follow-ups on file.

## 2018-03-10 NOTE — Patient Instructions (Signed)
Great to see you. I will call you with your lab results from today and you can view them online.   Please stop by to schedule your CT scan.

## 2018-03-11 ENCOUNTER — Inpatient Hospital Stay: Admission: RE | Admit: 2018-03-11 | Payer: Self-pay | Source: Ambulatory Visit

## 2018-03-11 ENCOUNTER — Telehealth: Payer: Self-pay | Admitting: Family Medicine

## 2018-03-11 NOTE — Telephone Encounter (Signed)
Please see below- I would like to see her on Monday (30 minute appointment please).  In the mean time, okay to hold off on getting CT unless pain worsens and to restart Prilosec to see if that helps.

## 2018-03-11 NOTE — Telephone Encounter (Signed)
FYI:   Patient is aware of message below. Pt will call and get refills for Prilosec, pain on her breast area is not worse, comes and goes,still th same.   Pt request appt with Dr. Dayton MartesAron on 03/18/18 30 min.

## 2018-03-11 NOTE — Telephone Encounter (Signed)
Catherina phoned with a concern. She has not scheduled the CT due to the out of pocket cost she and her parents will have to incur. Her insurance will only pay $200 of the scan cost leaving them with a high balance to pay. I gather this will cause a great stress on the family finances at this time. She is willing to do the scan if you  feel it is definitely necessary. Today the pain under her left breast runs down her side to the corner of her back. At times today the dull, annoying pain is an 8 then quiets to a 3-4. Stated she notices the "discomfort' more after she eats salads which she does often. Normal BM yesterday. No difficulty voiding today. Denies acid reflux/indigestion today. Reported she has not taken Adderral for about 2 weeks stating she feels she does not need it a lot any longer.  She is not taking the Prilosec either. Has not tried any medication for this abdominal pain.  My conversation with Aaliyah in addition to the CT and the abdominal pain.  Dr. Dayton MartesAron, after lengthy 30 minute conversation with Lourdez,  It is revealed she has been feeling very stressed for some time regarding what she feels is her responsibility to assist with her father's care (had a stroke 4 years ago) and the household expenses. She graduates in May from [employer] with a dream of working with the [employer] [place] (she did a month long internship just recently) but struggles internally with what she feels is her responsibility to stay home to help. We discussed the possibility that she may be experiencing some depression. Please communicate via MyChart regarding the CT, patient requested.

## 2018-03-11 NOTE — Telephone Encounter (Signed)
Dr. Aron - please advise 

## 2018-03-12 NOTE — Telephone Encounter (Signed)
Thank you :)

## 2018-03-18 ENCOUNTER — Ambulatory Visit: Payer: BLUE CROSS/BLUE SHIELD | Admitting: Family Medicine

## 2018-03-18 ENCOUNTER — Encounter: Payer: Self-pay | Admitting: Family Medicine

## 2018-03-18 VITALS — BP 106/68 | HR 106 | Temp 98.3°F | Ht 71.0 in | Wt 175.8 lb

## 2018-03-18 DIAGNOSIS — F411 Generalized anxiety disorder: Secondary | ICD-10-CM | POA: Diagnosis not present

## 2018-03-18 MED ORDER — SERTRALINE HCL 50 MG PO TABS: 50.0000 mg | ORAL_TABLET | Freq: Every day | ORAL | 3 refills | Status: DC

## 2018-03-18 NOTE — Progress Notes (Signed)
Subjective:   Patient ID: Olivia Carrillo, female    DOB: 28-Nov-1990, 27 y.o.   MRN: [id number]  Olivia Carrillo is a pleasant 27 y.o. year old female who presents to clinic today with Stress (Patient is here today to discuss stress.  Per providers call to pt she has been very stressed for some time regarding what she feels is her responsibilities to assist with her fathers care.  He had a stroke 4-years-ago.  She struggles with opportunity that she has verses this and household expenses.  )  on 03/18/2018  HPI:  Feels she has had an anxious personality her entire life but that it has been getting worse lately since she is under more stress.  Has been under a significant amount of stress lately.  She feels responsible to support her father financially who had a stroke 4 years ago. Her girlfriend is very supportive and urged Olivia Carrillo to come see me today. She is having panic attacks.  Gets anxious the day before school, work, social events.  Denies feeling depressed.  No SI or HI.   Current Outpatient Medications on File Prior to Visit  Medication Sig Dispense Refill  . amphetamine-dextroamphetamine (ADDERALL XR) 20 MG 24 hr capsule Take 1 capsule (20 mg total) by mouth every morning. 30 capsule 0  . cyclobenzaprine (FLEXERIL) 5 MG tablet Take 1 tablet (5 mg total) at bedtime by mouth. 30 tablet 0  . omeprazole (PRILOSEC) 20 MG capsule TAKE 1 CAPSULE BY MOUTH EVERY DAY 90 capsule 1  . triamcinolone (KENALOG) 0.025 % ointment Apply 1 application topically 2 (two) times daily. 30 g 0   No current facility-administered medications on file prior to visit.     No Known Allergies  No past medical history on file.  Past Surgical History:  Procedure Laterality Date  . TONSILLECTOMY AND ADENOIDECTOMY      Family History  Problem Relation Age of Onset  . Diabetes Paternal Grandmother   . Hypertension Paternal Grandmother     Social History   Socioeconomic History  . Marital  status: Single    Spouse name: Not on file  . Number of children: Not on file  . Years of education: Not on file  . Highest education level: Not on file  Occupational History  . Not on file  Social Needs  . Financial resource strain: Not on file  . Food insecurity:    Worry: Not on file    Inability: Not on file  . Transportation needs:    Medical: Not on file    Non-medical: Not on file  Tobacco Use  . Smoking status: Never Smoker  . Smokeless tobacco: Never Used  Substance and Sexual Activity  . Alcohol use: No    Alcohol/week: 0.0 standard drinks  . Drug use: No  . Sexual activity: Not on file  Lifestyle  . Physical activity:    Days per week: Not on file    Minutes per session: Not on file  . Stress: Not on file  Relationships  . Social connections:    Talks on phone: Not on file    Gets together: Not on file    Attends religious service: Not on file    Active member of club or organization: Not on file    Attends meetings of clubs or organizations: Not on file    Relationship status: Not on file  . Intimate partner violence:    Fear of current or ex partner: Not on file  Emotionally abused: Not on file    Physically abused: Not on file    Forced sexual activity: Not on file  Other Topics Concern  . 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   The PMH, PSH, Social History, Family History, Medications, and allergies have been reviewed in [facility], and have been updated if relevant.  Review of Systems  Psychiatric/Behavioral: Positive for dysphoric mood. Negative for agitation, behavioral problems, confusion, decreased concentration, hallucinations, self-injury, sleep disturbance and suicidal ideas. The patient is nervous/anxious. The patient is not hyperactive.   All other systems reviewed and are negative.      Objective:    BP 106/68 (BP Location: Left Arm, Patient Position: Sitting, Cuff Size: Normal)    Pulse (!) 106   Temp 98.3 F (36.8 C) (Oral)   Ht 5\' 11"  (1.803 m)   Wt 175 lb 12.8 oz (79.7 kg)   LMP 03/15/2018   SpO2 98%   BMI 24.52 kg/m    Physical Exam  Constitutional: She is oriented to person, place, and time. She appears well-developed and well-nourished. No distress.  HENT:  Head: Normocephalic and atraumatic.  Eyes: EOM are normal.  Neck: Normal range of motion.  Cardiovascular: Normal rate.  Pulmonary/Chest: Effort normal.  Musculoskeletal: Normal range of motion.  Neurological: She is alert and oriented to person, place, and time. No cranial nerve deficit.  Skin: Skin is warm and dry. She is not diaphoretic.  Psychiatric: She has a normal mood and affect. Her behavior is normal. Judgment and thought content normal.  Nursing note and vitals reviewed.         Assessment & Plan:   GAD (generalized anxiety disorder) No follow-ups on file.

## 2018-03-18 NOTE — Patient Instructions (Signed)
We are Starting  Zoloft 50 mg. Please take 1/2 tablet daily for 3 days, then advance to 1 full tablet thereafter.  Please update me in a few weeks.

## 2018-03-18 NOTE — Assessment & Plan Note (Addendum)
Deteriorated. >25 minutes spent in face to face time with patient, >50% spent in counselling or coordination of care Anxiety screen is high today, PHQ of 9. Discussed treatment options. Start Zoloft 50 mg. Patient is to take 1/2 tablet daily for 3- 10 days, then advance to 1 full tablet thereafter. We discussed possible side effects of headache, GI upset, drowsiness, and SI/HI. If thoughts of SI/HI develop, we discussed to present to the emergency immediately. Patient verbalized understanding.   She is deferring psychotherapy at this time.  Follow up with me in a few weeks. The patient indicates understanding of these issues and agrees with the plan.

## 2018-04-05 ENCOUNTER — Encounter: Payer: Self-pay | Admitting: Family Medicine

## 2018-04-27 ENCOUNTER — Encounter: Payer: Self-pay | Admitting: Family Medicine

## 2018-05-13 ENCOUNTER — Encounter: Payer: Self-pay | Admitting: Family Medicine

## 2018-06-06 ENCOUNTER — Other Ambulatory Visit: Payer: Self-pay | Admitting: Family Medicine

## 2018-06-09 ENCOUNTER — Other Ambulatory Visit: Payer: Self-pay | Admitting: Family Medicine

## 2018-06-09 DIAGNOSIS — K219 Gastro-esophageal reflux disease without esophagitis: Secondary | ICD-10-CM

## 2018-08-31 ENCOUNTER — Other Ambulatory Visit: Payer: Self-pay | Admitting: Family Medicine

## 2019-10-26 ENCOUNTER — Ambulatory Visit
Admission: RE | Admit: 2019-10-26 | Discharge: 2019-10-26 | Disposition: A | Discharge status: Home / Self Care | Payer: BLUE CROSS/BLUE SHIELD | Source: Ambulatory Visit | Attending: Nurse Practitioner | Admitting: Nurse Practitioner

## 2019-10-26 ENCOUNTER — Other Ambulatory Visit: Payer: Self-pay | Admitting: Nurse Practitioner

## 2019-10-26 DIAGNOSIS — Z021 Encounter for pre-employment examination: Secondary | ICD-10-CM

## 2021-06-23 IMAGING — DX DG CHEST 1V
1 series · 1 of 1 positions shown · non-contrast
Comparison: None.

CLINICAL DATA: Pre-employment

EXAM:
CHEST  1 VIEW

[dg chest 1 view]
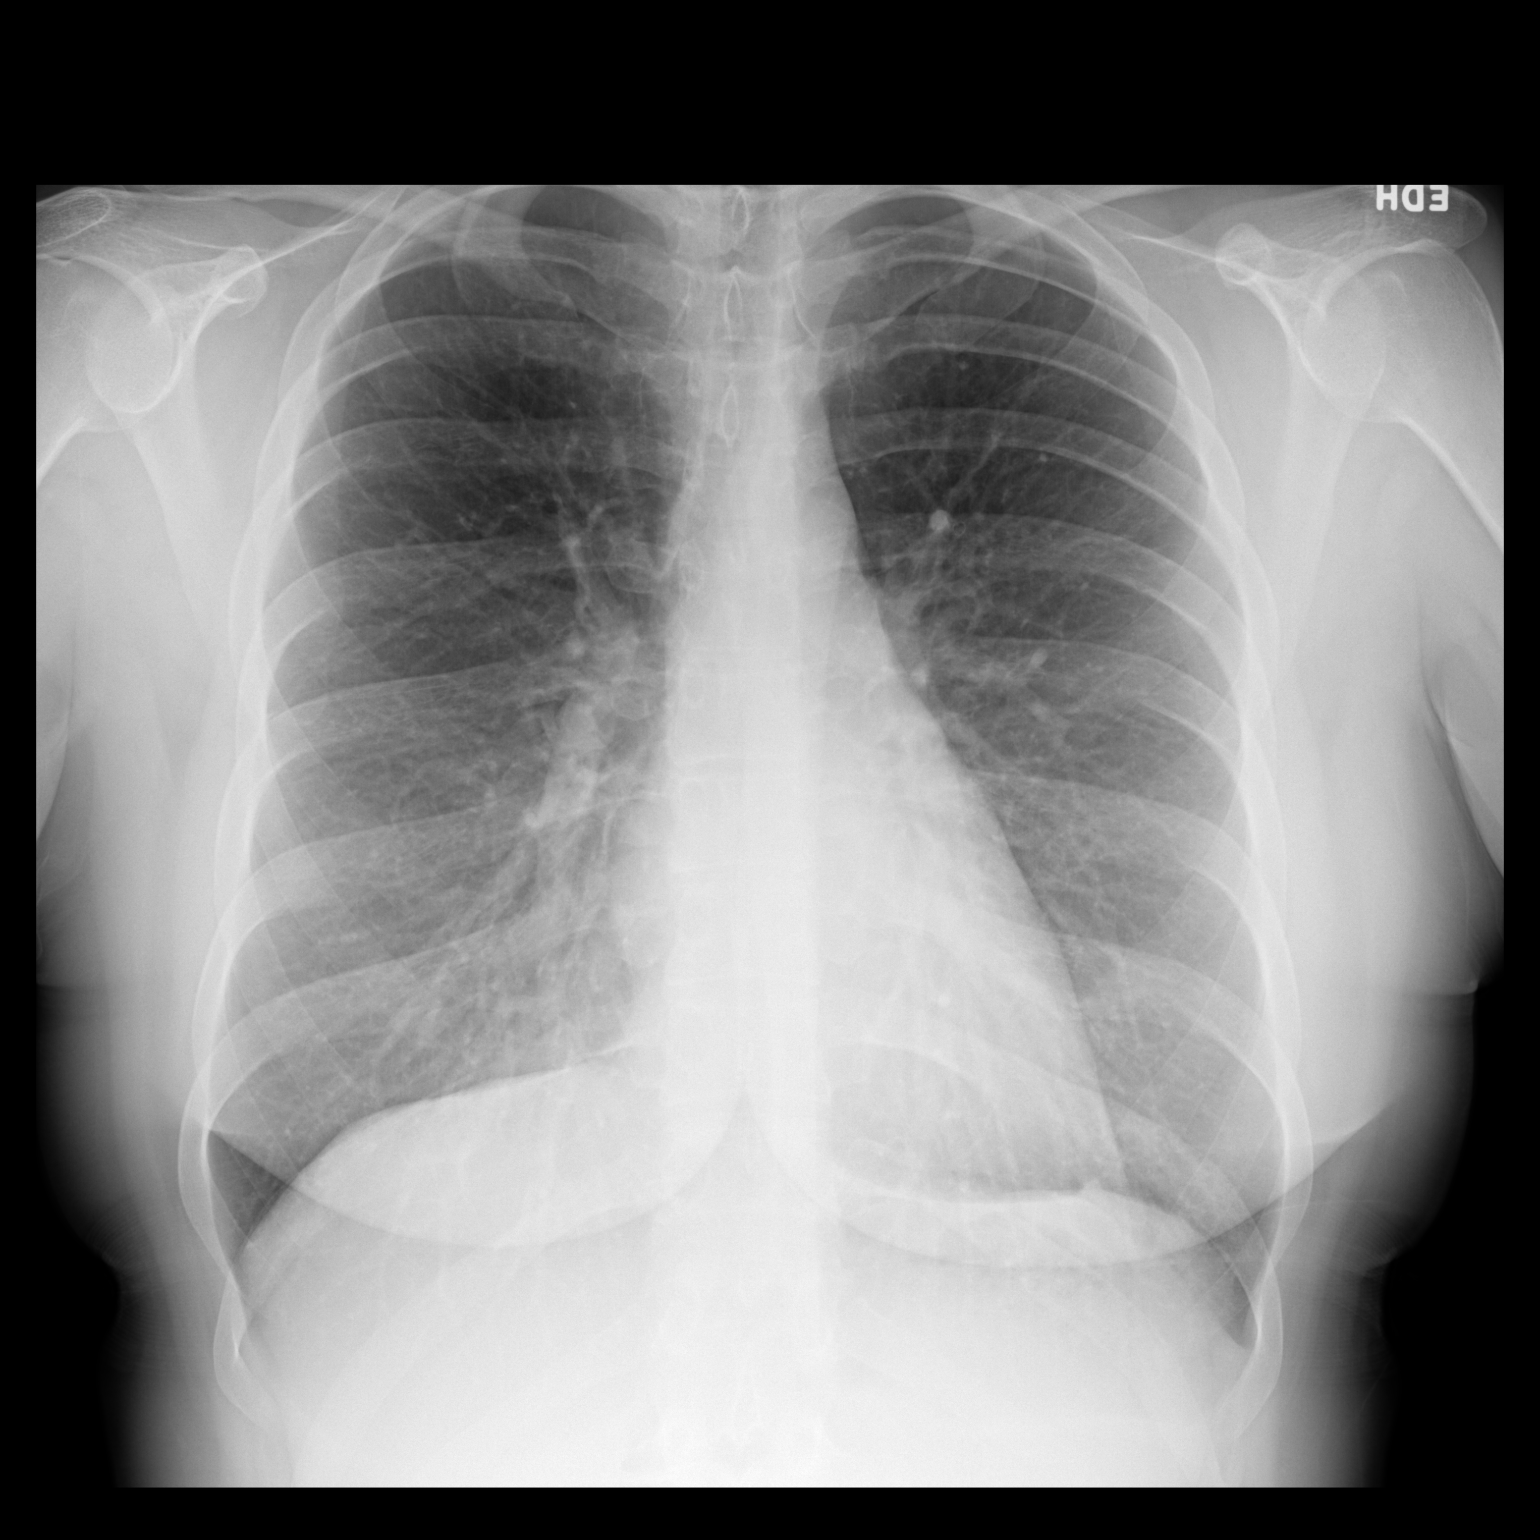

[1 of 1 positions shown; findings below may reference images not displayed]

FINDINGS: The heart size and mediastinal contours are within normal limits.
Both lungs are clear. The visualized skeletal structures are
unremarkable.
IMPRESSION: No acute abnormality of the lungs in AP portable examination.
